# Patient Record
Sex: Female | Born: 1965 | Race: White | Hispanic: No | Marital: Married | Smoking: Former smoker
Health system: Southern US, Community
[De-identification: ages and names within clinical notes are randomized; demographics above are authoritative.]

## PROBLEM LIST (undated history)

## (undated) DIAGNOSIS — K219 Gastro-esophageal reflux disease without esophagitis: Secondary | ICD-10-CM

## (undated) DIAGNOSIS — E669 Obesity, unspecified: Secondary | ICD-10-CM

## (undated) DIAGNOSIS — I10 Essential (primary) hypertension: Secondary | ICD-10-CM

## (undated) DIAGNOSIS — F329 Major depressive disorder, single episode, unspecified: Secondary | ICD-10-CM

## (undated) DIAGNOSIS — R87629 Unspecified abnormal cytological findings in specimens from vagina: Secondary | ICD-10-CM

## (undated) DIAGNOSIS — E876 Hypokalemia: Secondary | ICD-10-CM

## (undated) DIAGNOSIS — T7840XA Allergy, unspecified, initial encounter: Secondary | ICD-10-CM

## (undated) DIAGNOSIS — F32A Depression, unspecified: Secondary | ICD-10-CM

## (undated) HISTORY — PX: CHOLECYSTECTOMY: SHX55

## (undated) HISTORY — PX: DILATION AND CURETTAGE OF UTERUS: SHX78

## (undated) HISTORY — PX: ADENOIDECTOMY: SUR15

## (undated) HISTORY — DX: Unspecified abnormal cytological findings in specimens from vagina: R87.629

## (undated) HISTORY — DX: Depression, unspecified: F32.A

## (undated) HISTORY — DX: Obesity, unspecified: E66.9

## (undated) HISTORY — DX: Hypokalemia: E87.6

## (undated) HISTORY — DX: Gastro-esophageal reflux disease without esophagitis: K21.9

## (undated) HISTORY — DX: Major depressive disorder, single episode, unspecified: F32.9

## (undated) HISTORY — DX: Allergy, unspecified, initial encounter: T78.40XA

## (undated) HISTORY — PX: AUGMENTATION MAMMAPLASTY: SUR837

## (undated) HISTORY — DX: Essential (primary) hypertension: I10

---

## 2013-05-20 ENCOUNTER — Ambulatory Visit: Payer: Self-pay | Admitting: Obstetrics and Gynecology

## 2014-01-27 ENCOUNTER — Ambulatory Visit: Payer: Self-pay | Admitting: Obstetrics and Gynecology

## 2014-02-03 ENCOUNTER — Ambulatory Visit: Payer: Self-pay | Admitting: Obstetrics and Gynecology

## 2015-01-05 LAB — HM PAP SMEAR: HM PAP: NEGATIVE

## 2015-01-10 ENCOUNTER — Other Ambulatory Visit: Payer: Self-pay | Admitting: Obstetrics and Gynecology

## 2015-01-10 DIAGNOSIS — N63 Unspecified lump in unspecified breast: Secondary | ICD-10-CM

## 2015-01-17 ENCOUNTER — Other Ambulatory Visit: Payer: Self-pay

## 2015-01-26 ENCOUNTER — Other Ambulatory Visit: Payer: Self-pay | Admitting: Obstetrics and Gynecology

## 2015-01-26 ENCOUNTER — Ambulatory Visit
Admission: RE | Admit: 2015-01-26 | Discharge: 2015-01-26 | Disposition: A | Discharge status: Home / Self Care | Payer: 59 | Source: Ambulatory Visit | Attending: Obstetrics and Gynecology | Admitting: Obstetrics and Gynecology

## 2015-01-26 ENCOUNTER — Ambulatory Visit: Admission: RE | Admit: 2015-01-26 | Payer: 59 | Source: Ambulatory Visit

## 2015-01-26 DIAGNOSIS — N63 Unspecified lump in unspecified breast: Secondary | ICD-10-CM

## 2015-03-02 ENCOUNTER — Encounter: Payer: Self-pay | Admitting: Family Medicine

## 2015-03-02 ENCOUNTER — Ambulatory Visit (INDEPENDENT_AMBULATORY_CARE_PROVIDER_SITE_OTHER): Payer: 59 | Admitting: Family Medicine

## 2015-03-02 VITALS — BP 116/72 | HR 80 | Temp 98.1°F | Resp 16 | Ht 67.0 in | Wt 214.3 lb

## 2015-03-02 DIAGNOSIS — R928 Other abnormal and inconclusive findings on diagnostic imaging of breast: Secondary | ICD-10-CM | POA: Insufficient documentation

## 2015-03-02 DIAGNOSIS — IMO0001 Reserved for inherently not codable concepts without codable children: Secondary | ICD-10-CM | POA: Insufficient documentation

## 2015-03-02 DIAGNOSIS — R768 Other specified abnormal immunological findings in serum: Secondary | ICD-10-CM | POA: Insufficient documentation

## 2015-03-02 DIAGNOSIS — F3341 Major depressive disorder, recurrent, in partial remission: Secondary | ICD-10-CM

## 2015-03-02 DIAGNOSIS — R1012 Left upper quadrant pain: Secondary | ICD-10-CM | POA: Insufficient documentation

## 2015-03-02 DIAGNOSIS — Z3040 Encounter for surveillance of contraceptives, unspecified: Secondary | ICD-10-CM | POA: Insufficient documentation

## 2015-03-02 DIAGNOSIS — L989 Disorder of the skin and subcutaneous tissue, unspecified: Secondary | ICD-10-CM | POA: Diagnosis not present

## 2015-03-02 DIAGNOSIS — R6889 Other general symptoms and signs: Secondary | ICD-10-CM | POA: Insufficient documentation

## 2015-03-02 DIAGNOSIS — M533 Sacrococcygeal disorders, not elsewhere classified: Secondary | ICD-10-CM

## 2015-03-02 DIAGNOSIS — R030 Elevated blood-pressure reading, without diagnosis of hypertension: Secondary | ICD-10-CM | POA: Insufficient documentation

## 2015-03-02 DIAGNOSIS — B49 Unspecified mycosis: Secondary | ICD-10-CM | POA: Insufficient documentation

## 2015-03-02 DIAGNOSIS — E66811 Obesity, class 1: Secondary | ICD-10-CM | POA: Insufficient documentation

## 2015-03-02 DIAGNOSIS — E538 Deficiency of other specified B group vitamins: Secondary | ICD-10-CM

## 2015-03-02 DIAGNOSIS — H9209 Otalgia, unspecified ear: Secondary | ICD-10-CM | POA: Insufficient documentation

## 2015-03-02 DIAGNOSIS — L209 Atopic dermatitis, unspecified: Secondary | ICD-10-CM | POA: Insufficient documentation

## 2015-03-02 DIAGNOSIS — R748 Abnormal levels of other serum enzymes: Secondary | ICD-10-CM | POA: Insufficient documentation

## 2015-03-02 DIAGNOSIS — R109 Unspecified abdominal pain: Secondary | ICD-10-CM | POA: Insufficient documentation

## 2015-03-02 DIAGNOSIS — Z01419 Encounter for gynecological examination (general) (routine) without abnormal findings: Secondary | ICD-10-CM | POA: Insufficient documentation

## 2015-03-02 DIAGNOSIS — E669 Obesity, unspecified: Secondary | ICD-10-CM | POA: Insufficient documentation

## 2015-03-02 DIAGNOSIS — R739 Hyperglycemia, unspecified: Secondary | ICD-10-CM | POA: Insufficient documentation

## 2015-03-02 MED ORDER — CITALOPRAM HYDROBROMIDE 40 MG PO TABS: 40.0000 mg | ORAL_TABLET | Freq: Every day | ORAL | Status: DC

## 2015-03-02 MED ORDER — TRAMADOL HCL 50 MG PO TABS: 50.0000 mg | ORAL_TABLET | Freq: Four times a day (QID) | ORAL | Status: DC | PRN

## 2015-03-02 MED ORDER — CYANOCOBALAMIN 1000 MCG/ML IJ SOLN
1000.0000 ug | Freq: Once | INTRAMUSCULAR | Status: AC
  Administered 2015-03-02: 1000 ug via INTRAMUSCULAR

## 2015-03-02 NOTE — Progress Notes (Signed)
Name: Gloria RiedelKaren C Pierce   MRN: [id number]    DOB: December 28, 1965   Date:03/02/2015       Progress Note  Subjective  Chief Complaint  Chief Complaint  Patient presents with  . Tailbone Pain    patient states that she has been having some pain right about her rectum and it radiates to the hips. Patient states it hurts more when she sits for a while. This statetd about 2 weeks ago.  Marland Kitchen. Referral    dermatology ([facility] - Dr. Cheree DittoGraham)  . Injections    B-12  . Sexual Problem    patient is concerned about low sex drive    HPI  Depression: Patient complains of depression. She complains of depressed mood, difficulty concentrating and fatigue. Onset was approximately several years ago, gradually worsening since that time.  She denies current suicidal and homicidal plan or intent.   Family history significant for anxiety and depression.Possible organic causes contributing are: medications, endocrine/metabolic, nutritional.  Risk factors: previous episode of depression Previous treatment includes Celexa and none. She complains of the following side effects from the treatment: decreased libido.  Skin lesion: Patient complains of skin lesion involving the torso and trunk. Rash started several years ago. Appearance of rash at onset: Texture of lesion(s): flat. Rash has not changed over time Initial distribution: back.  Discomfort associated with rash: causes no discomfort.  Associated symptoms: none. Denies: fever, headache, nausea and vomiting. Patient has had previous evaluation of rash. Patient has had previous treatment.  Response to treatment: good. Needs referral to return to see Dermatologist.   Back Pain: Patient presents for presents evaluation of low back problems.  Symptoms have been present for a few weeks and include pain in sacrum (aching and dull in character; 6/10 in severity). Initial inciting event: none. Symptoms are worst: if sitting or drivign. Alleviating  factors identifiable by patient are standing and walking. Exacerbating factors identifiable by patient are sitting. Treatments so far initiated by patient: tylenol. Denies fall or change in activity.  Is due for her B12 shot.    Patient Active Problem List   Diagnosis Date Noted  . Abdominal pain, recurrent 03/02/2015  . Nonspecific abnormal finding 03/02/2015  . Abnormal finding on mammography 03/02/2015  . Abdominal pain, left upper quadrant 03/02/2015  . Contraceptive surveillance 03/02/2015  . Delivery normal 03/02/2015  . AD (atopic dermatitis) 03/02/2015  . Dermatitis, eczematoid 03/02/2015  . Blood pressure elevated 03/02/2015  . Elevated blood sugar 03/02/2015  . Elevated lipase 03/02/2015  . BP (high blood pressure) 03/02/2015  . Unilateral earache 03/02/2015  . Mild depression 03/02/2015  . Adiposity 03/02/2015  . Helicobacter pylori ab+ 03/02/2015  . Encounter for routine gynecological examination 03/02/2015  . Disease caused by fungus 03/02/2015    History  Substance Use Topics  . Smoking status: Former Games developermoker  . Smokeless tobacco: Not on file  . Alcohol Use: 0.0 oz/week    0 Standard drinks or equivalent per week     Comment: very rare, ocassional mix drink     Current outpatient prescriptions:  .  citalopram (CELEXA) 20 MG tablet, , Disp: , Rfl:  .  hydrochlorothiazide (HYDRODIURIL) 25 MG tablet, Take by mouth., Disp: , Rfl:  .  ORTHO MICRONOR 0.35 MG tablet, TK 1 T PO QD, Disp: , Rfl: 4 .  phentermine (ADIPEX-P) 37.5 MG tablet, Take by mouth., Disp: , Rfl:  .  potassium chloride (K-DUR) 10 MEQ tablet, TK 1  T PO TWICE A WEEK., Disp: , Rfl: 1 .  cyanocobalamin (,VITAMIN B-12,) 1000 MCG/ML injection, CYANOCOBALAMIN, 1000MCG/ML (Injection Solution)  1 (one) Solution monthly for 1 days  Quantity: 3;  Refills: 2   Ordered :17-Nov-2014  Ines Bloomer, Melody CNM;  Started 17-Nov-2014 Active, Disp: , Rfl:   Past Surgical History  Procedure Laterality Date  . Augmentation  mammaplasty      2006  . Cholecystectomy    . Adenoidectomy    . Dilation and curettage of uterus      Family History  Problem Relation Age of Onset  . Heart disease Mother   . Diabetes Father   . Heart disease Father   . Diabetes Maternal Grandmother     Allergies  Allergen Reactions  . Codeine   . Penicillins     AND CLECION     Review of Systems  CONSTITUTIONAL: No significant weight changes, fever, chills, weakness or fatigue.  HEENT:  - Eyes: No visual changes.  - Ears: No auditory changes. No pain.  - Nose: No sneezing, congestion, runny nose. - Throat: No sore throat. No changes in swallowing. SKIN: Lesion on back. CARDIOVASCULAR: No chest pain, chest pressure or chest discomfort. No palpitations or edema.  RESPIRATORY: No shortness of breath, cough or sputum.  GASTROINTESTINAL: No anorexia, nausea, vomiting. No changes in bowel habits. No abdominal pain or blood.  GENITOURINARY: No dysuria. No frequency. No discharge.  NEUROLOGICAL: No headache, dizziness, syncope, paralysis, ataxia, numbness or tingling in the extremities. No memory changes. No change in bowel or bladder control.  MUSCULOSKELETAL: Yes joint pain. No muscle pain. HEMATOLOGIC: No anemia, bleeding or bruising.  LYMPHATICS: No enlarged lymph nodes.  PSYCHIATRIC: Yes change in mood. No change in sleep pattern.  ENDOCRINOLOGIC: No reports of sweating, cold or heat intolerance. No polyuria or polydipsia.      Objective  BP 116/72 mmHg  Pulse 80  Temp(Src) 98.1 F (36.7 C) (Oral)  Resp 16  Ht 5\' 7"  (1.702 m)  Wt 214 lb 4.8 oz (97.206 kg)  BMI 33.56 kg/m2  SpO2 98% Body mass index is 33.56 kg/(m^2).  Depression screen PHQ 2/9 03/02/2015  Decreased Interest 1  Down, Depressed, Hopeless 1  PHQ - 2 Score 2  Altered sleeping 1  Tired, decreased energy 3  Change in appetite 0  Feeling bad or failure about yourself  0  Trouble concentrating 0  Moving slowly or fidgety/restless 0  Suicidal  thoughts 0  PHQ-9 Score 6  Difficult doing work/chores Somewhat difficult      Physical Exam  Constitutional: Patient is obese and well-nourished. In no distress.  HEENT:  - Head: Normocephalic and atraumatic.  - Ears: Bilateral TMs gray, no erythema or effusion - Nose: Nasal mucosa moist - Mouth/Throat: Oropharynx is clear and moist. No tonsillar hypertrophy or erythema. No post nasal drainage.  - Eyes: Conjunctivae clear, EOM movements normal. PERRLA. No scleral icterus.  Neck: Normal range of motion. Neck supple. No JVD present. No thyromegaly present.  Cardiovascular: Normal rate, regular rhythm and normal heart sounds.  No murmur heard.  Pulmonary/Chest: Effort normal and breath sounds normal. No respiratory distress. Musculoskeletal: Normal range of motion bilateral UE and LE, no joint effusions. Lumbar spine exaggerated Lordosis curve with no paraspinal muscle tenderness. Straight leg test negative. Bilateral hip joints negative for pain internal external flexion extension rotation, full ROM.  Peripheral vascular: Bilateral LE no edema. Neurological: CN II-XII grossly intact with no focal deficits. Alert and oriented to person, place,  and time. Coordination, balance, strength, speech and gait are normal.  Skin: Skin is warm and dry. No rash noted. Scattered flat hyperpigmented lesions on mid to lower back. Psychiatric: Patient has a stable mood and affect. Behavior is normal in office today. Judgment and thought content normal in office today.   Assessment & Plan   1. Skin lesion of back  - Ambulatory referral to Dermatology  2. Depression, major, recurrent, in partial remission Increase celexa from  to . Monitor libido.   - citalopram (CELEXA) 40 MG tablet; Take 1 tablet (40 mg total) by mouth daily.  Dispense: 30 tablet; Refill: 5  3. Vitamin B12 deficiency Shot given in office today.  - cyanocobalamin ((VITAMIN B-12)) injection 1,000 mcg; Inject 1 mL (1,000  mcg total) into the muscle once.  4. Sacral back pain Will defer xray unless symptoms become ongoing. Instructed on home exercises. Encouraged her to go back to Dr. Anne Hahn, chiropractor.  - traMADol (ULTRAM) 50 MG tablet; Take 1 tablet (50 mg total) by mouth every 6 (six) hours as needed for moderate pain or severe pain.  Dispense: 60 tablet; Refill: 0

## 2015-03-02 NOTE — Patient Instructions (Signed)

## 2015-05-08 ENCOUNTER — Other Ambulatory Visit: Payer: Self-pay | Admitting: *Deleted

## 2015-05-08 MED ORDER — METRONIDAZOLE 500 MG PO TABS: 500.0000 mg | ORAL_TABLET | Freq: Two times a day (BID) | ORAL | Status: DC

## 2015-05-08 MED ORDER — TETRACYCLINE HCL 250 MG PO CAPS: 250.0000 mg | ORAL_CAPSULE | Freq: Four times a day (QID) | ORAL | Status: DC

## 2015-06-15 IMAGING — US ABDOMEN ULTRASOUND
1 series · 14 of 25 positions shown · non-contrast
Comparison: none

REASON FOR EXAM: LUQ abd pain
COMMENTS:

[Series 1: abdomen ultrasound · 0.30mm/px · 14 of 91 slices shown]
[im 1/91]
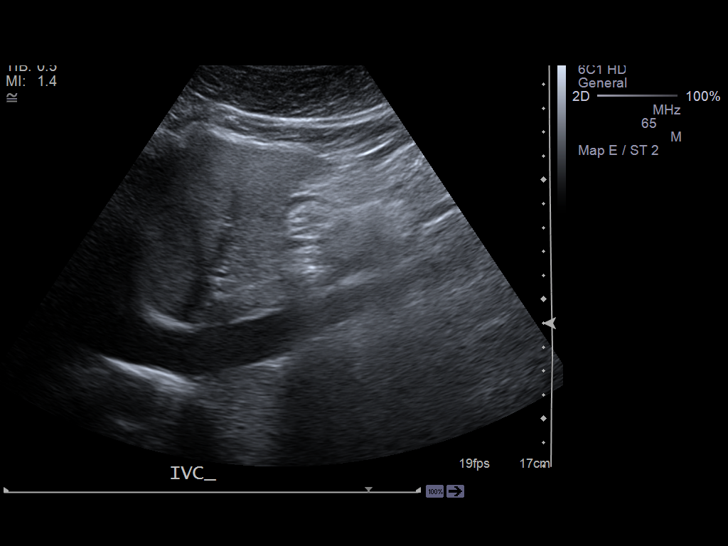
[im 8/91]
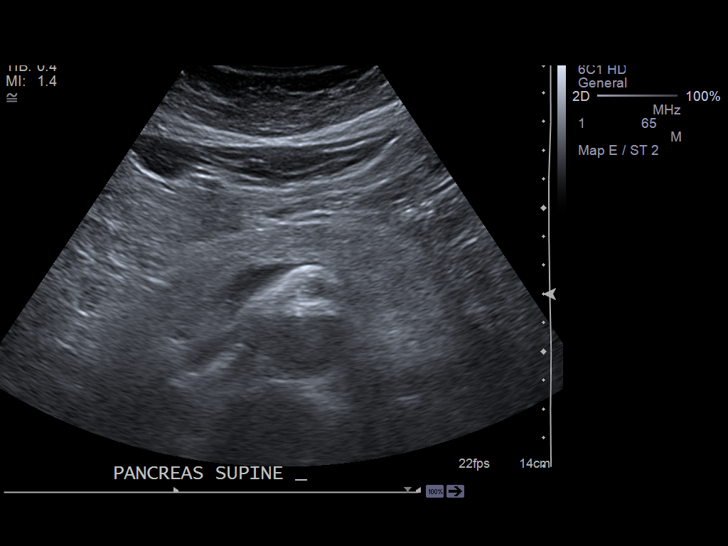
[im 16/91]
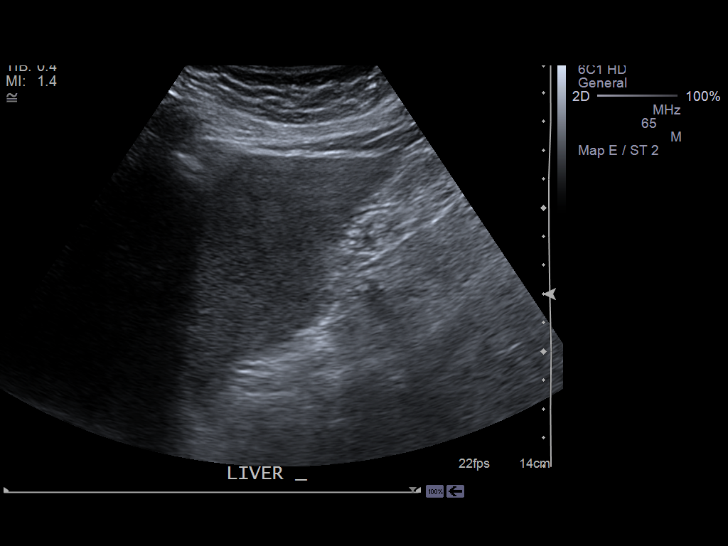
[im 23/91]
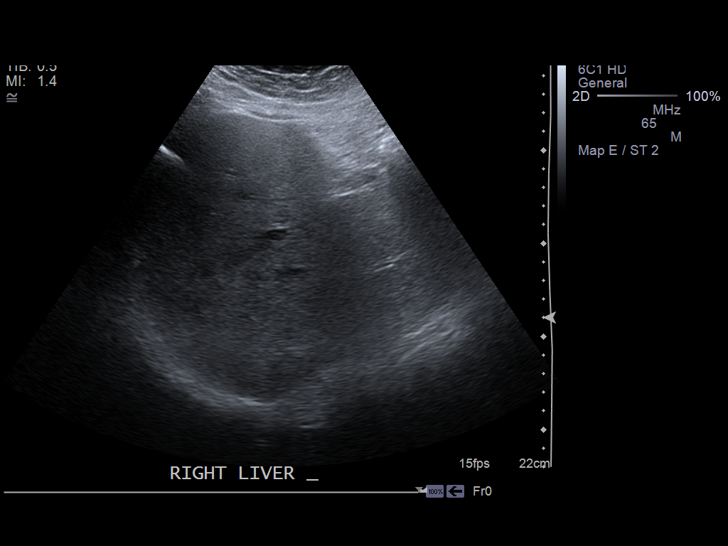
[im 31/91]
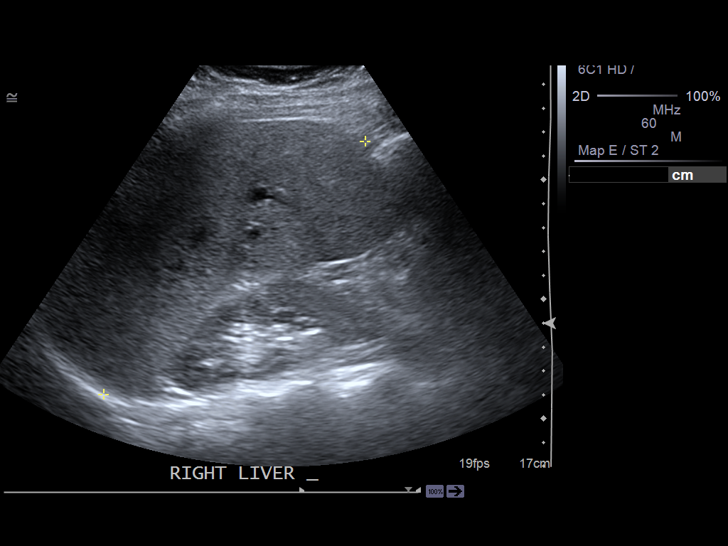
[im 34/91]
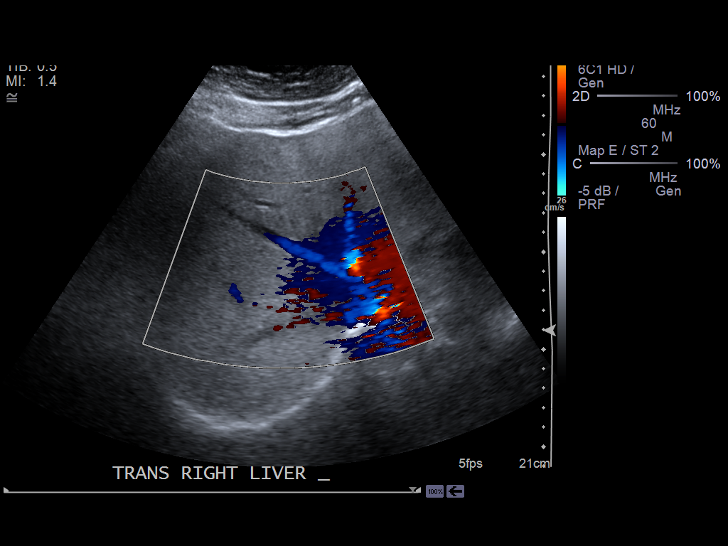
[im 42/91]
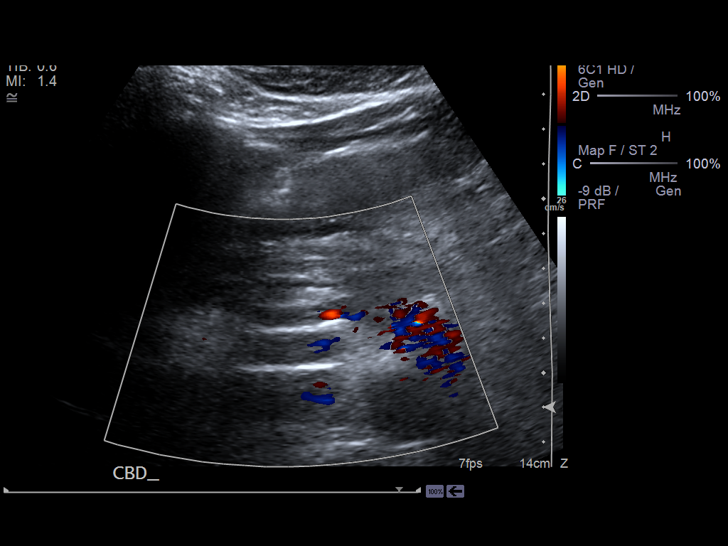
[im 49/91]
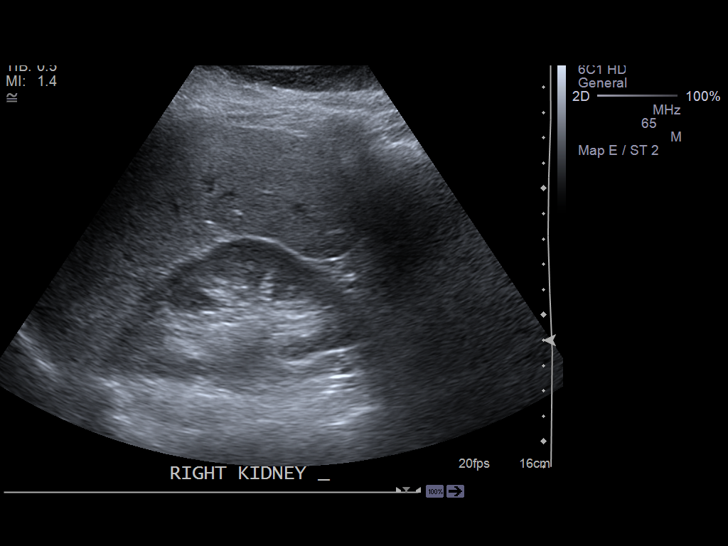
[im 57/91]
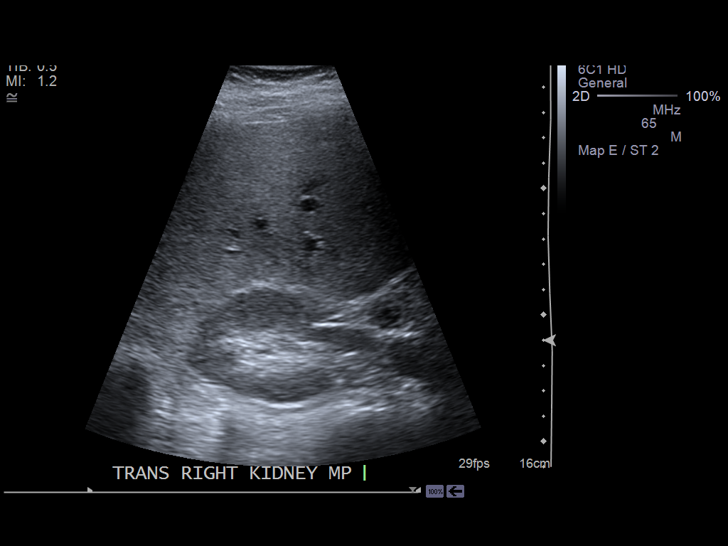
[im 61/91]
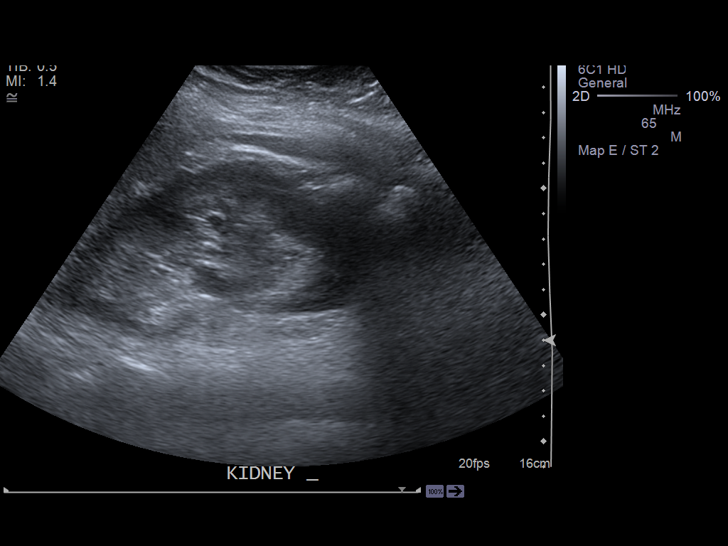
[im 68/91]
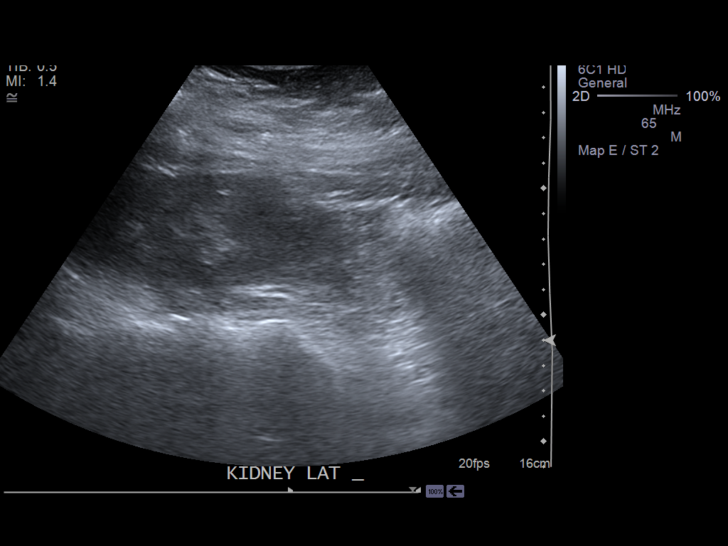
[im 76/91]
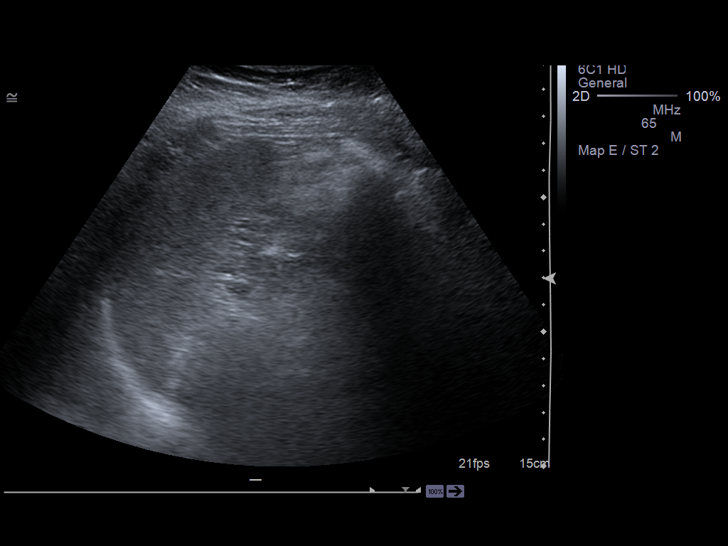
[im 83/91]
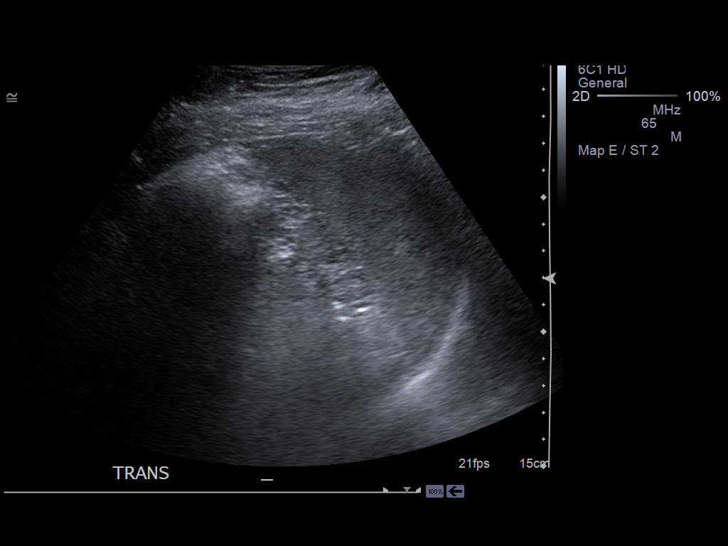
[im 91/91]
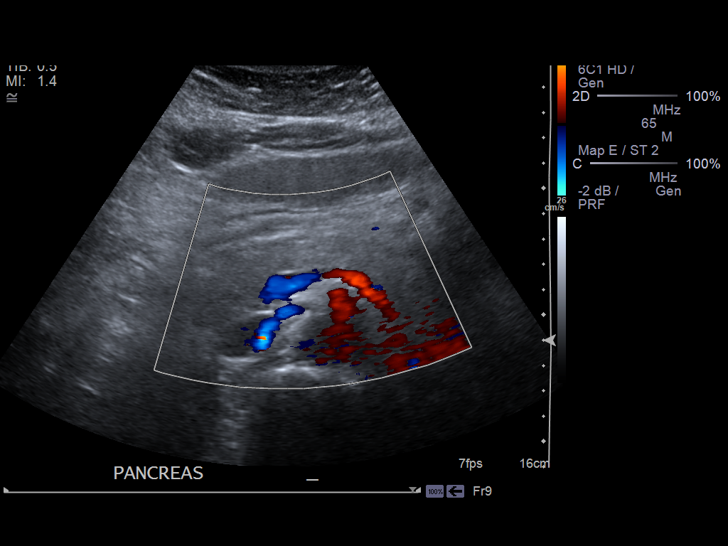

[14 of 25 positions shown; findings below may reference images not displayed]

PROCEDURE:     US  - US ABDOMEN GENERAL SURVEY  - May 20, 2013  [DATE]

RESULT:     Abdominal ultrasound is performed. The patient has undergone
previous cholecystectomy. The proximal inferior vena cava is patent. The
abdominal aorta tapers distally without evidence of aneurysm. The visualized
pancreas appears unremarkable without mass or ductal dilation. The liver
echotexture is slightly heterogeneous with a liver length of 15.25 cm
demonstrated. The portal venous flow is normal. The common bile but diameter
measures 5.3 mm which is borderline distended. There is no ascites. The
right kidney measures 10.40 x 4.72 x 4.45 cm. The left kidney measures
x 5.59 x 6.06 cm. Neither kidney shows obstruction or solid or cystic mass.
No stones are evident. The spleen measures up to 7.60 cm in length and
appears homogeneous.
IMPRESSION: 1. Status post cholecystectomy. No focal abnormality.

[REDACTED]

## 2015-08-18 ENCOUNTER — Other Ambulatory Visit: Payer: Self-pay | Admitting: Obstetrics and Gynecology

## 2015-08-28 ENCOUNTER — Other Ambulatory Visit: Payer: Self-pay

## 2015-08-28 DIAGNOSIS — F3341 Major depressive disorder, recurrent, in partial remission: Secondary | ICD-10-CM

## 2015-08-28 MED ORDER — CITALOPRAM HYDROBROMIDE 40 MG PO TABS: 40.0000 mg | ORAL_TABLET | Freq: Every day | ORAL | Status: DC

## 2015-08-28 NOTE — Telephone Encounter (Signed)
[facility] a refill of this patient Celexa 40mg .  Refill request was sent to Dr. Edwena FeltyAshany Sundaram for approval and submission.

## 2015-12-03 ENCOUNTER — Other Ambulatory Visit: Payer: Self-pay | Admitting: *Deleted

## 2015-12-03 DIAGNOSIS — M79671 Pain in right foot: Secondary | ICD-10-CM

## 2015-12-03 DIAGNOSIS — M79672 Pain in left foot: Principal | ICD-10-CM

## 2015-12-03 MED ORDER — AZELASTINE-FLUTICASONE 137-50 MCG/ACT NA SUSP: 1.0000 | Freq: Every day | NASAL | Status: DC

## 2015-12-07 ENCOUNTER — Ambulatory Visit: Payer: Self-pay | Admitting: Family Medicine

## 2015-12-07 ENCOUNTER — Telehealth: Payer: Self-pay | Admitting: Obstetrics and Gynecology

## 2015-12-07 NOTE — Telephone Encounter (Signed)
Have them send us a prior auth form as this is what she has been on

## 2015-12-07 NOTE — Telephone Encounter (Signed)
Alvino ChapelEllen at [facility] Called and said that [employer]ler's insurance does not cover Dymista. Call to resolve issue.

## 2015-12-08 ENCOUNTER — Other Ambulatory Visit: Payer: Self-pay | Admitting: Obstetrics and Gynecology

## 2015-12-10 NOTE — Telephone Encounter (Signed)
Sent PA to [facility]

## 2015-12-24 ENCOUNTER — Telehealth: Payer: Self-pay | Admitting: Obstetrics and Gynecology

## 2015-12-24 NOTE — Telephone Encounter (Signed)
pls advise

## 2015-12-24 NOTE — Telephone Encounter (Signed)
Gloria Pierce IS THE PT (GOD DAUGHTER OF Allye Franzen//  APPT COMING UP SHE WANTS TO KNOW WHAT ITS ABOUT. SHE SAID IS MELODY GOING TO GIVE HER A PILL TO START HER PERIOD OR WHAT?

## 2015-12-25 NOTE — Telephone Encounter (Signed)
Please let her know, now that Gloria Pierce is >50yo, we can not give out any info unless see is added to HIPPA forms.

## 2015-12-25 NOTE — Telephone Encounter (Signed)
Spoke with pt

## 2016-01-04 ENCOUNTER — Encounter: Payer: Self-pay | Admitting: Sports Medicine

## 2016-01-04 ENCOUNTER — Ambulatory Visit (INDEPENDENT_AMBULATORY_CARE_PROVIDER_SITE_OTHER): Payer: BLUE CROSS/BLUE SHIELD

## 2016-01-04 ENCOUNTER — Ambulatory Visit (INDEPENDENT_AMBULATORY_CARE_PROVIDER_SITE_OTHER): Payer: BLUE CROSS/BLUE SHIELD | Admitting: Sports Medicine

## 2016-01-04 DIAGNOSIS — M79673 Pain in unspecified foot: Secondary | ICD-10-CM

## 2016-01-04 DIAGNOSIS — M779 Enthesopathy, unspecified: Secondary | ICD-10-CM | POA: Diagnosis not present

## 2016-01-04 DIAGNOSIS — M2040 Other hammer toe(s) (acquired), unspecified foot: Secondary | ICD-10-CM

## 2016-01-04 DIAGNOSIS — M21619 Bunion of unspecified foot: Secondary | ICD-10-CM

## 2016-01-04 DIAGNOSIS — M722 Plantar fascial fibromatosis: Secondary | ICD-10-CM | POA: Diagnosis not present

## 2016-01-04 MED ORDER — TRIAMCINOLONE ACETONIDE 10 MG/ML IJ SUSP: 10.0000 mg | Freq: Once | INTRAMUSCULAR | Status: AC

## 2016-01-04 MED ORDER — METHYLPREDNISOLONE 4 MG PO TBPK: ORAL_TABLET | ORAL | Status: DC

## 2016-01-04 MED ORDER — MELOXICAM 15 MG PO TABS: 15.0000 mg | ORAL_TABLET | Freq: Every day | ORAL | Status: DC

## 2016-01-04 NOTE — Progress Notes (Signed)
Patient ID: Gloria Pierce, female   DOB: 1965-10-17, 50 y.o.   MRN: [id number] Subjective: Gloria RiedelKaren C Pierce is a 50 y.o. female patient presents to office with complaint of 1. Left 2nd toe pain with numbness and 2.bilateral heel pain that is worse at night or after a period of rest and go to stand. This has been present for a couple of months. Patient has treated this problem with motrin and tylenol with no relief. Reports that the Vionic sandals help. Denies any other pedal complaints.   Patient Active Problem List   Diagnosis Date Noted  . Abdominal pain, recurrent 03/02/2015  . Nonspecific abnormal finding 03/02/2015  . Abnormal finding on mammography 03/02/2015  . Abdominal pain, left upper quadrant 03/02/2015  . Contraceptive surveillance 03/02/2015  . Vitamin B12 deficiency 03/02/2015  . AD (atopic dermatitis) 03/02/2015  . Skin lesion of back 03/02/2015  . Elevated blood sugar 03/02/2015  . Elevated lipase 03/02/2015  . Elevated blood pressure reading without diagnosis of hypertension 03/02/2015  . Depression, major, recurrent, in partial remission (HCC) 03/02/2015  . Obesity, Class I, BMI 30-34.9 03/02/2015  . Helicobacter pylori ab+ 03/02/2015  . Encounter for routine gynecological examination 03/02/2015  . Disease caused by fungus 03/02/2015    Current Outpatient Prescriptions on File Prior to Visit  Medication Sig Dispense Refill  . Azelastine-Fluticasone (DYMISTA) 137-50 MCG/ACT SUSP Place 1 spray into the nose daily. 1 Bottle 3  . citalopram (CELEXA) 40 MG tablet Take 1 tablet (40 mg total) by mouth daily. 90 tablet 1  . cyanocobalamin (,VITAMIN B-12,) 1000 MCG/ML injection CYANOCOBALAMIN, 1000MCG/ML (Injection Solution)  1 (one) Solution monthly for 1 days  Quantity: 3;  Refills: 2   Ordered :17-Nov-2014  Ines BloomerBurr, Melody CNM;  Started 17-Nov-2014 Active    . hydrochlorothiazide (HYDRODIURIL) 25 MG tablet Take by mouth.    . metroNIDAZOLE (FLAGYL) 500 MG tablet Take 1 tablet  (500 mg total) by mouth 2 (two) times daily. 28 tablet 0  . ORTHO MICRONOR 0.35 MG tablet TK 1 T PO QD  4  . phentermine (ADIPEX-P) 37.5 MG tablet Take by mouth.    . potassium chloride (K-DUR) 10 MEQ tablet TAKE 1 TABLET BY MOUTH TWICE A WEEK 30 tablet 0  . tetracycline (ACHROMYCIN,SUMYCIN) 250 MG capsule Take 1 capsule (250 mg total) by mouth 4 (four) times daily. 28 capsule 0  . traMADol (ULTRAM) 50 MG tablet Take 1 tablet (50 mg total) by mouth every 6 (six) hours as needed for moderate pain or severe pain. 60 tablet 0   No current facility-administered medications on file prior to visit.    Allergies  Allergen Reactions  . Codeine   . Penicillins     AND CLECION    Objective: Physical Exam General: The patient is alert and oriented x3 in no acute distress. Obese  Dermatology: Skin is warm, dry and supple bilateral lower extremities. Nails 1-10 are normal. There is no erythema, edema, no eccymosis, no open lesions present. Integument is otherwise unremarkable.  Vascular: Dorsalis Pedis pulse and Posterior Tibial pulse are 2/4 bilateral. Capillary fill time is immediate to all digits.  Neurological: Grossly intact to light touch with an achilles reflex of +2/5 and a  negative Tinel's sign bilateral. Negative moulders sign on left.   Musculoskeletal: Tenderness to palpation at the left 2nd MTPJ and medial calcaneal tubercale and through the insertion of the plantar fascia on the bilateral. No pain with compression of calcaneus bilateral. No pain with tuning fork  to calcaneus bilateral. No pain with calf compression bilateral. There is decreased Ankle joint range of motion bilateral. All other joints range of motion within normal limits bilateral. Strength 5/5 in all groups bilateral. Early bunion and hammertoe L>R.   Xray, Right and Left foot:  Normal osseous mineralization. Joint spaces preserved. Early bunion and hammertoe, midfoot arthritic changes, No fracture/dislocation/boney  destruction. Calcaneal spur present with mild thickening of plantar fascia. No other soft tissue abnormalities or radiopaque foreign bodies.   Assessment and Plan: Problem List Items Addressed This Visit    None    Visit Diagnoses    Foot pain, unspecified laterality    -  Primary    Relevant Medications    triamcinolone acetonide (KENALOG) 10 MG/ML injection 10 mg    methylPREDNISolone (MEDROL DOSEPAK) 4 MG TBPK tablet    meloxicam (MOBIC) 15 MG tablet    Other Relevant Orders    DG Foot 2 Views Left    DG Foot 2 Views Right    Plantar fasciitis, bilateral        Relevant Medications    methylPREDNISolone (MEDROL DOSEPAK) 4 MG TBPK tablet    meloxicam (MOBIC) 15 MG tablet    Capsulitis        Left 2nd MTPJ    Relevant Medications    triamcinolone acetonide (KENALOG) 10 MG/ML injection 10 mg    methylPREDNISolone (MEDROL DOSEPAK) 4 MG TBPK tablet    meloxicam (MOBIC) 15 MG tablet    Bunion        Relevant Medications    methylPREDNISolone (MEDROL DOSEPAK) 4 MG TBPK tablet    meloxicam (MOBIC) 15 MG tablet    Hammertoe, unspecified laterality        Relevant Medications    methylPREDNISolone (MEDROL DOSEPAK) 4 MG TBPK tablet    meloxicam (MOBIC) 15 MG tablet       -Complete examination performed. -Xrays reviewed   -Discussed with patient in detail the condition of capsulitis and plantar fasciitis, how this occurs and general treatment options. Explained both conservative and surgical treatments.  -After oral consent and aseptic prep, injected a mixture containing 1 ml of 2%  plain lidocaine, 1 ml 0.5% plain marcaine, 0.5 ml of kenalog 10 and 0.5 ml of dexamethasone phosphate into left 2nd MTPJ at point of max tenderness Post-injection care discussed with patient.  -Rx Meloxicamto start after Medrol dose pack is completed -Recommended good supportive shoes and advised use of OTC insert. Explained to patient that if these orthoses work well, we will continue with these. If  these do not improve her condition and  pain, we will consider custom molded orthoses. - Explained in detail the use of the fascial braces which were dispensed at today's visit. -Explained and dispensed to patient daily stretching exercises. -Recommend patient to ice affected area 1-2x daily. -Patient to return to office in 3 weeks for follow up or sooner if problems or questions arise.  Asencion Islam, DPM

## 2016-01-04 NOTE — Patient Instructions (Signed)

## 2016-01-07 ENCOUNTER — Encounter: Payer: Self-pay | Admitting: *Deleted

## 2016-01-08 ENCOUNTER — Other Ambulatory Visit: Payer: Self-pay | Admitting: Obstetrics and Gynecology

## 2016-01-11 ENCOUNTER — Encounter: Payer: Self-pay | Admitting: Obstetrics and Gynecology

## 2016-01-11 ENCOUNTER — Ambulatory Visit (INDEPENDENT_AMBULATORY_CARE_PROVIDER_SITE_OTHER): Payer: BLUE CROSS/BLUE SHIELD | Admitting: Obstetrics and Gynecology

## 2016-01-11 VITALS — BP 134/87 | HR 65 | Ht 66.0 in | Wt 232.9 lb

## 2016-01-11 DIAGNOSIS — Z01419 Encounter for gynecological examination (general) (routine) without abnormal findings: Secondary | ICD-10-CM | POA: Diagnosis not present

## 2016-01-11 DIAGNOSIS — F3341 Major depressive disorder, recurrent, in partial remission: Secondary | ICD-10-CM | POA: Diagnosis not present

## 2016-01-11 MED ORDER — HYDROCHLOROTHIAZIDE 25 MG PO TABS: 25.0000 mg | ORAL_TABLET | Freq: Every day | ORAL | Status: DC

## 2016-01-11 MED ORDER — CITALOPRAM HYDROBROMIDE 40 MG PO TABS: 40.0000 mg | ORAL_TABLET | Freq: Every day | ORAL | Status: DC

## 2016-01-11 MED ORDER — ORTHO MICRONOR 0.35 MG PO TABS: ORAL_TABLET | ORAL | Status: DC

## 2016-01-11 MED ORDER — POTASSIUM CHLORIDE ER 10 MEQ PO TBCR: EXTENDED_RELEASE_TABLET | ORAL | Status: DC

## 2016-01-11 MED ORDER — CYANOCOBALAMIN 1000 MCG/ML IJ SOLN: INTRAMUSCULAR | Status: AC

## 2016-01-11 MED ORDER — PHENTERMINE HCL 37.5 MG PO TABS: 37.5000 mg | ORAL_TABLET | Freq: Every day | ORAL | Status: DC

## 2016-01-11 NOTE — Patient Instructions (Signed)
  Place annual gynecologic exam patient instructions here.  Thank you for enrolling in MyChart. Please follow the instructions below to securely access your online medical record. MyChart allows you to send messages to your doctor, view your test results, manage appointments, and more.   How Do I Sign Up? 1. In your Internet browser, go to [employer] Address [employer] and enter [email or web address]. 2. Click on the Sign Up Now link in the Sign In box. You will see the New Member Sign Up page. 3. Enter your MyChart Access Code exactly as it appears below. You will not need to use this code after you've completed the sign-up process. If you do not sign up before the expiration date, you must request a new code.  MyChart Access Code: [email or web address] Expires: 01/25/2016 11:35 AM  4. Enter your Social Security Number ([id number]) and Date of Birth (mm/dd/yyyy) as indicated and click Submit. You will be taken to the next sign-up page. 5. Create a MyChart ID. This will be your MyChart login ID and cannot be changed, so think of one that is secure and easy to remember. 6. Create a MyChart password. You can change your password at any time. 7. Enter your Password Reset Question and Answer. This can be used at a later time if you forget your password.  8. Enter your e-mail address. You will receive e-mail notification when new information is available in MyChart. 9. Click Sign Up. You can now view your medical record.   Additional Information Remember, MyChart is NOT to be used for urgent needs. For medical emergencies, dial 911.

## 2016-01-11 NOTE — Progress Notes (Signed)
Subjective:   Gloria Pierce is a 50 y.o. G58P1 Caucasian female here for a routine well-woman exam.  No LMP recorded. Patient is not currently having periods (Reason: Oral contraceptives).    Current complaints: weight gain; desires restart weight loss meds. PCP: sudaram       doesn't desire labs  Social History: Sexual: heterosexual Marital Status: married Living situation: with spouse Occupation: hairdresser Tobacco/alcohol: no tobacco use Illicit drugs: no history of illicit drug use  The following portions of the patient's history were reviewed and updated as appropriate: allergies, current medications, past family history, past medical history, past social history, past surgical history and problem list.  Past Medical History Past Medical History  Diagnosis Date  . Depression   . GERD (gastroesophageal reflux disease)   . Allergy   . Vaginal Pap smear, abnormal   . Low potassium syndrome   . Hypertension   . Obesity     Past Surgical History Past Surgical History  Procedure Laterality Date  . Augmentation mammaplasty      2006  . Cholecystectomy    . Adenoidectomy    . Dilation and curettage of uterus      Gynecologic History G3P1  No LMP recorded. Patient is not currently having periods (Reason: Oral contraceptives). Contraception: oral progesterone-only contraceptive Last Pap: 2016. Results were: normal Last mammogram: 2017. Results were: abnormal   Obstetric History OB History  Gravida Para Term Preterm AB SAB TAB Ectopic Multiple Living  3 1            # Outcome Date GA Lbr Len/2nd Weight Sex Delivery Anes PTL Lv  3 Gravida           2 Gravida           1 Para               Current Medications Current Outpatient Prescriptions on File Prior to Visit  Medication Sig Dispense Refill  . meloxicam (MOBIC) 15 MG tablet Take 1 tablet (15 mg total) by mouth daily. 30 tablet 0  . Azelastine-Fluticasone (DYMISTA) 137-50 MCG/ACT SUSP Place 1 spray into the  nose daily. (Patient not taking: Reported on 01/11/2016) 1 Bottle 3  . methylPREDNISolone (MEDROL DOSEPAK) 4 MG TBPK tablet Take first as instructed (Patient not taking: Reported on 01/11/2016) 21 tablet 0  . metroNIDAZOLE (FLAGYL) 500 MG tablet Take 1 tablet (500 mg total) by mouth 2 (two) times daily. (Patient not taking: Reported on 01/11/2016) 28 tablet 0  . tetracycline (ACHROMYCIN,SUMYCIN) 250 MG capsule Take 1 capsule (250 mg total) by mouth 4 (four) times daily. (Patient not taking: Reported on 01/11/2016) 28 capsule 0  . traMADol (ULTRAM) 50 MG tablet Take 1 tablet (50 mg total) by mouth every 6 (six) hours as needed for moderate pain or severe pain. (Patient not taking: Reported on 01/11/2016) 60 tablet 0   Current Facility-Administered Medications on File Prior to Visit  Medication Dose Route Frequency Provider Last Rate Last Dose  . triamcinolone acetonide (KENALOG) 10 MG/ML injection 10 mg  10 mg Other Once Asencion Islam, DPM        Review of Systems Patient denies any headaches, blurred vision, shortness of breath, chest pain, abdominal pain, problems with bowel movements, urination, or intercourse.  Objective:  BP 134/87 mmHg  Pulse 65  Ht  (1.676 m)  Wt 232 lb 14.4 oz (105.643 kg)  BMI 37.61 kg/m2 Physical Exam  General:  Well developed, well nourished, no acute distress. She  is alert and oriented x3. Skin:  Warm and dry Neck:  Midline trachea, no thyromegaly or nodules Cardiovascular: Regular rate and rhythm, no murmur heard Lungs:  Effort normal, all lung fields clear to auscultation bilaterally Breasts:  No dominant palpable mass, retraction, or nipple discharge Abdomen:  Soft, non tender, no hepatosplenomegaly or masses Pelvic:  External genitalia is normal in appearance.  The vagina is normal in appearance. The cervix is bulbous, no CMT.  Thin prep pap is not done. Uterus is felt to be normal size, shape, and contour.  No adnexal masses or tenderness  noted. Extremities:  No swelling or varicosities noted Psych:  She has a normal mood and affect  Assessment:   Healthy well-woman exam Obesity HTN SSRI use  Plan:  Restart weigh tloss meds- will wait on labs as insurance doesn't cover them F/U 1 year  for AE, or sooner if needed Mammogram scheduled  Jocelyne Reinertsen Suzan NailerN Skyanne Welle, CNM

## 2016-01-15 ENCOUNTER — Other Ambulatory Visit: Payer: Self-pay | Admitting: *Deleted

## 2016-01-15 ENCOUNTER — Telehealth: Payer: Self-pay | Admitting: Obstetrics and Gynecology

## 2016-01-15 DIAGNOSIS — Z01419 Encounter for gynecological examination (general) (routine) without abnormal findings: Secondary | ICD-10-CM

## 2016-01-15 NOTE — Telephone Encounter (Signed)
Her order is not in for mammogram, can you put it in and she wants me to call her and let her know.

## 2016-01-15 NOTE — Telephone Encounter (Signed)
Patient called stating she is having issues scheduling her mammogram. [facility] is telling her that the order is in incorrectly and the office needs to call them directly to get her scheduled. Patient is frustrated as she says this happens every year. Thanks

## 2016-01-16 ENCOUNTER — Other Ambulatory Visit: Payer: Self-pay | Admitting: *Deleted

## 2016-01-16 DIAGNOSIS — Z01419 Encounter for gynecological examination (general) (routine) without abnormal findings: Secondary | ICD-10-CM

## 2016-01-16 NOTE — Telephone Encounter (Signed)
See other message

## 2016-01-16 NOTE — Telephone Encounter (Signed)
Called jamie @ [facility], got correct codes

## 2016-01-16 NOTE — Telephone Encounter (Signed)
Changed pts mammo order hopefully correct this time

## 2016-02-01 ENCOUNTER — Encounter: Payer: Self-pay | Admitting: Sports Medicine

## 2016-02-01 ENCOUNTER — Ambulatory Visit (INDEPENDENT_AMBULATORY_CARE_PROVIDER_SITE_OTHER): Payer: BLUE CROSS/BLUE SHIELD | Admitting: Sports Medicine

## 2016-02-01 DIAGNOSIS — M2040 Other hammer toe(s) (acquired), unspecified foot: Secondary | ICD-10-CM | POA: Diagnosis not present

## 2016-02-01 DIAGNOSIS — M21619 Bunion of unspecified foot: Secondary | ICD-10-CM

## 2016-02-01 DIAGNOSIS — M79673 Pain in unspecified foot: Secondary | ICD-10-CM

## 2016-02-01 DIAGNOSIS — M779 Enthesopathy, unspecified: Secondary | ICD-10-CM

## 2016-02-01 DIAGNOSIS — M722 Plantar fascial fibromatosis: Secondary | ICD-10-CM

## 2016-02-01 MED ORDER — MELOXICAM 15 MG PO TABS: 15.0000 mg | ORAL_TABLET | Freq: Every day | ORAL | Status: DC

## 2016-02-01 NOTE — Progress Notes (Signed)
Patient ID: Gloria RiedelKaren C Pierce, female   DOB: [birth year], 50 y.o.   MRN: [id number] Subjective: Gloria RiedelKaren C Pierce is a 50 y.o. female returns to office for follow up evaluation after Left 2nd MTPJ injection for capsulitis and for follow up of bilateral heel pain. Patient states that the injection seems to help her pain at the 2nd toe however pain is still present at heels that feels best after stretching. Patient denies any other recent changes in medications or new problems since last visit.   Patient Active Problem List   Diagnosis Date Noted  . Contraceptive surveillance 03/02/2015  . Vitamin B12 deficiency 03/02/2015  . AD (atopic dermatitis) 03/02/2015  . Elevated blood sugar 03/02/2015  . Elevated lipase 03/02/2015  . Depression, major, recurrent, in partial remission (HCC) 03/02/2015    Current Outpatient Prescriptions on File Prior to Visit  Medication Sig Dispense Refill  . Azelastine-Fluticasone (DYMISTA) 137-50 MCG/ACT SUSP Place 1 spray into the nose daily. (Patient not taking: Reported on 01/11/2016) 1 Bottle 3  . citalopram (CELEXA) 40 MG tablet Take 1 tablet (40 mg total) by mouth daily. 90 tablet 4  . cyanocobalamin (,VITAMIN B-12,) 1000 MCG/ML injection Reported on 01/11/2016 3 mL 4  . hydrochlorothiazide (HYDRODIURIL) 25 MG tablet Take 1 tablet (25 mg total) by mouth daily. 90 tablet 4  . meloxicam (MOBIC) 15 MG tablet Take 1 tablet (15 mg total) by mouth daily. 30 tablet 0  . methylPREDNISolone (MEDROL DOSEPAK) 4 MG TBPK tablet Take first as instructed (Patient not taking: Reported on 01/11/2016) 21 tablet 0  . metroNIDAZOLE (FLAGYL) 500 MG tablet Take 1 tablet (500 mg total) by mouth 2 (two) times daily. (Patient not taking: Reported on 01/11/2016) 28 tablet 0  . ORTHO MICRONOR 0.35 MG tablet TK 1 T PO QD 1 Package 12  . phentermine (ADIPEX-P) 37.5 MG tablet Take 1 tablet (37.5 mg total) by mouth daily before breakfast. Reported on 01/11/2016 30 tablet 2  . potassium chloride (K-DUR) 10  MEQ tablet TAKE 1 TABLET BY MOUTH TWICE A WEEK 90 tablet 4  . tetracycline (ACHROMYCIN,SUMYCIN) 250 MG capsule Take 1 capsule (250 mg total) by mouth 4 (four) times daily. (Patient not taking: Reported on 01/11/2016) 28 capsule 0  . traMADol (ULTRAM) 50 MG tablet Take 1 tablet (50 mg total) by mouth every 6 (six) hours as needed for moderate pain or severe pain. (Patient not taking: Reported on 01/11/2016) 60 tablet 0   Current Facility-Administered Medications on File Prior to Visit  Medication Dose Route Frequency Provider Last Rate Last Dose  . triamcinolone acetonide (KENALOG) 10 MG/ML injection 10 mg  10 mg Other Once Asencion Islamitorya Elynore Dolinski, DPM        Allergies  Allergen Reactions  . Codeine   . Penicillins     AND CLECION    Objective:   General:  Alert and oriented x 3, in no acute distress  Dermatology: Skin is warm, dry, and supple bilateral. Nails are within normal limits. There is no lower extremity erythema, no eccymosis, no open lesions present bilateral.   Vascular: Dorsalis Pedis and Posterior Tibial pedal pulses are 2/4 bilateral. + hair growth noted bilateral. Capillary Fill Time is 3 seconds in all digits. No varicosities, No edema bilateral lower extremities.   Neurological: Sensation grossly intact to light touch with an achilles reflex of +2 and a  negative Tinel's sign bilateral. Vibratory, sharp/dull, Semmes Weinstein Monofilament within normal limits.   Musculoskeletal: There is no pain to Left 2nd MTPJ  with early hammertoe, there is decreased tenderness to palpation at the medial calcaneal tubercale and through the insertion of the plantar fascia bilateral. No pain with compression to calcaneus or application of tuning fork. There is decreased Ankle joint range of motion bilateral. All other joints range of motion  within normal limits bilateral. Strength 5/5 bilateral.   Assessment and Plan: Problem List Items Addressed This Visit    None    Visit Diagnoses     Plantar fasciitis, bilateral    -  Primary    Relevant Medications    meloxicam (MOBIC) 15 MG tablet    Capsulitis        Relevant Medications    meloxicam (MOBIC) 15 MG tablet    Bunion        Relevant Medications    meloxicam (MOBIC) 15 MG tablet    Hammertoe, unspecified laterality        Relevant Medications    meloxicam (MOBIC) 15 MG tablet    Foot pain, unspecified laterality        Relevant Medications    meloxicam (MOBIC) 15 MG tablet    Capsulitis        Left 2nd MTPJ    Relevant Medications    meloxicam (MOBIC) 15 MG tablet      -Complete examination performed.  -Previous x-rays reviewed. -Discussed with patient in detail the condition of plantar fasciitis, how this  occurs related to the foot type of the patient and general treatment options. -Dispensed night splint and instructed on use  -Refilled Mobic  -Continue with fascial braces, stretching, icing, good supportive shoes, inserts daily.  -Discussed long term care and reocurrence; will closely monitor; if fails to improve will consider other treatment modalities.  -Patient to return to office in 1 month for follow up or sooner if problems or questions arise.  Asencion Islam, DPM

## 2016-02-05 ENCOUNTER — Other Ambulatory Visit: Payer: Self-pay | Admitting: Obstetrics and Gynecology

## 2016-02-05 MED ORDER — PHENTERMINE HCL 37.5 MG PO TABS: 37.5000 mg | ORAL_TABLET | Freq: Two times a day (BID) | ORAL | Status: AC

## 2016-02-22 ENCOUNTER — Ambulatory Visit
Admission: RE | Admit: 2016-02-22 | Discharge: 2016-02-22 | Disposition: A | Discharge status: Home / Self Care | Payer: BLUE CROSS/BLUE SHIELD | Source: Ambulatory Visit | Attending: Obstetrics and Gynecology | Admitting: Obstetrics and Gynecology

## 2016-02-22 ENCOUNTER — Other Ambulatory Visit: Payer: Self-pay | Admitting: Obstetrics and Gynecology

## 2016-02-22 DIAGNOSIS — Z01419 Encounter for gynecological examination (general) (routine) without abnormal findings: Secondary | ICD-10-CM | POA: Diagnosis not present

## 2016-03-01 ENCOUNTER — Other Ambulatory Visit: Payer: Self-pay | Admitting: Sports Medicine

## 2016-03-05 ENCOUNTER — Telehealth: Payer: Self-pay

## 2016-03-05 NOTE — Telephone Encounter (Signed)
Refill is fine. Mobic 15mg  i tab daily qty 30 Thanks Dr. Marylene LandStover

## 2016-03-05 NOTE — Telephone Encounter (Signed)
Pt requesting refill on meloxicam

## 2016-03-07 ENCOUNTER — Ambulatory Visit: Payer: BLUE CROSS/BLUE SHIELD | Admitting: Sports Medicine

## 2016-03-26 ENCOUNTER — Other Ambulatory Visit: Payer: Self-pay | Admitting: Obstetrics and Gynecology

## 2016-03-26 DIAGNOSIS — F32A Depression, unspecified: Secondary | ICD-10-CM

## 2016-03-26 DIAGNOSIS — F3341 Major depressive disorder, recurrent, in partial remission: Secondary | ICD-10-CM

## 2016-03-26 DIAGNOSIS — F329 Major depressive disorder, single episode, unspecified: Secondary | ICD-10-CM

## 2016-03-26 MED ORDER — CITALOPRAM HYDROBROMIDE 10 MG PO TABS: 20.0000 mg | ORAL_TABLET | Freq: Every day | ORAL | 0 refills | Status: DC

## 2016-03-26 MED ORDER — FLUOXETINE HCL 20 MG PO CAPS: 10.0000 mg | ORAL_CAPSULE | Freq: Every day | ORAL | 1 refills | Status: DC

## 2016-03-26 NOTE — Progress Notes (Signed)
[facility] Phone Call Documentation  I received a call from Gloria Pierce, with report of worsening depression over last few months. Reports stressors at home and death of father as some precipitating factors, and that is progressively getting worse. Denies suicidal thoughts, but does feel hopeless. Tearful all the time. Concerned that Celexa isn't working anymore.  Was open to trying a different medication and counseling (as long as covered by insurance). presription placed to decrease celexa and start prozac. referral to [employer]. Patient to notify me if symptoms worsen in the mean time.   [name] [name], CNM [facility]

## 2016-04-06 ENCOUNTER — Other Ambulatory Visit: Payer: Self-pay | Admitting: Sports Medicine

## 2016-04-07 NOTE — Telephone Encounter (Signed)
Pt needs an appt prior to future refills. 

## 2016-05-08 ENCOUNTER — Telehealth: Payer: Self-pay | Admitting: *Deleted

## 2016-05-08 MED ORDER — MELOXICAM 15 MG PO TABS: ORAL_TABLET | ORAL | 0 refills | Status: DC

## 2016-05-08 NOTE — Telephone Encounter (Signed)
Pt requesting refill on Mobic until she can be seen by Dr. Logan BoresEvans on September 22nd.  Sent in refill to pharmacy. Informed pt.

## 2016-05-13 ENCOUNTER — Other Ambulatory Visit: Payer: Self-pay | Admitting: Obstetrics and Gynecology

## 2016-05-13 MED ORDER — FLUOXETINE HCL 40 MG PO CAPS: 40.0000 mg | ORAL_CAPSULE | Freq: Every day | ORAL | 4 refills | Status: DC

## 2016-05-23 ENCOUNTER — Ambulatory Visit (INDEPENDENT_AMBULATORY_CARE_PROVIDER_SITE_OTHER): Payer: BLUE CROSS/BLUE SHIELD | Admitting: Podiatry

## 2016-05-23 DIAGNOSIS — M79673 Pain in unspecified foot: Secondary | ICD-10-CM

## 2016-05-23 DIAGNOSIS — M722 Plantar fascial fibromatosis: Secondary | ICD-10-CM

## 2016-05-26 MED ORDER — MELOXICAM 15 MG PO TABS: ORAL_TABLET | ORAL | 1 refills | Status: DC

## 2016-05-26 NOTE — Progress Notes (Signed)
Subjective: Patient returns the clinic today for follow-up evaluation of bilateral heel pain. Patient states the pain started about 4 weeks ago it's worse when she's not on meloxicam. She currently restarted her meloxicam on 05/08/2016. She presents for further treatment and evaluation  Objective: Physical Exam General: The patient is alert and oriented x3 in no acute distress.  Dermatology: Skin is warm, dry and supple bilateral lower extremities. Negative for open lesions or macerations bilateral.   Vascular: Dorsalis Pedis and Posterior Tibial pulses palpable bilateral.  Capillary fill time is immediate to all digits.  Neurological: Epicritic and protective threshold intact bilateral.   Musculoskeletal: Tenderness to palpation at the medial calcaneal tubercale and through the insertion of the plantar fascia of the bilateral feet. All other joints range of motion within normal limits bilateral. Strength 5/5 in all groups bilateral.   Assessment: #1 plantar fasciitis bilateral heels #2 pain in bilateral feet  Problem List Items Addressed This Visit    None    Visit Diagnoses   None.     Plan of Care:   1. Patient evaluated. Xrays reviewed.   2.Instructed patient regarding therapies and modalities at home to alleviate symptoms.  4. Rx for Meloxicam given to patient.  5. Today the patient was scanned for custom inserts, low-profile for dress shoes.  6. Return to clinic in 4 weeks for reevaluation and orthotics.    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

## 2016-06-13 ENCOUNTER — Encounter: Payer: Self-pay | Admitting: Podiatry

## 2016-06-13 ENCOUNTER — Ambulatory Visit (INDEPENDENT_AMBULATORY_CARE_PROVIDER_SITE_OTHER): Payer: BLUE CROSS/BLUE SHIELD | Admitting: Podiatry

## 2016-06-13 DIAGNOSIS — M722 Plantar fascial fibromatosis: Secondary | ICD-10-CM

## 2016-06-13 NOTE — Patient Instructions (Signed)

## 2016-06-13 NOTE — Progress Notes (Signed)
Dispensed patient's orthotics with oral and written instructions for wearing. Patient will follow up with Dr. Evans in 1 month for an orthotic check.  

## 2016-07-18 ENCOUNTER — Ambulatory Visit: Payer: BLUE CROSS/BLUE SHIELD | Admitting: Podiatry

## 2016-09-15 ENCOUNTER — Other Ambulatory Visit: Payer: Self-pay | Admitting: Obstetrics and Gynecology

## 2016-09-19 ENCOUNTER — Other Ambulatory Visit: Payer: Self-pay | Admitting: Podiatry

## 2016-09-19 NOTE — Telephone Encounter (Signed)
Pt needs an appt prior to future refills. 

## 2016-09-23 ENCOUNTER — Telehealth: Payer: Self-pay | Admitting: *Deleted

## 2016-09-23 ENCOUNTER — Other Ambulatory Visit: Payer: Self-pay | Admitting: Obstetrics and Gynecology

## 2016-09-23 DIAGNOSIS — R202 Paresthesia of skin: Principal | ICD-10-CM

## 2016-09-23 DIAGNOSIS — R200 Anesthesia of skin: Secondary | ICD-10-CM

## 2016-09-23 NOTE — Telephone Encounter (Signed)
done

## 2016-09-23 NOTE — Telephone Encounter (Signed)
Pt would like referral she is having pain in her L thumb

## 2016-10-14 ENCOUNTER — Other Ambulatory Visit: Payer: Self-pay | Admitting: Podiatry

## 2016-10-17 ENCOUNTER — Ambulatory Visit: Payer: BLUE CROSS/BLUE SHIELD | Admitting: Family Medicine

## 2016-10-24 ENCOUNTER — Ambulatory Visit (INDEPENDENT_AMBULATORY_CARE_PROVIDER_SITE_OTHER): Payer: BLUE CROSS/BLUE SHIELD | Admitting: Obstetrics and Gynecology

## 2016-10-24 DIAGNOSIS — Z23 Encounter for immunization: Secondary | ICD-10-CM

## 2016-10-24 MED ORDER — TETANUS-DIPHTH-ACELL PERTUSSIS 5-2.5-18.5 LF-MCG/0.5 IM SUSP
0.5000 mL | Freq: Once | INTRAMUSCULAR | Status: AC
  Administered 2016-10-24: 0.5 mL via INTRAMUSCULAR

## 2016-10-24 NOTE — Progress Notes (Signed)
Patient needs updated TDAP- given today by nurse. RTC as needed.

## 2016-12-31 ENCOUNTER — Other Ambulatory Visit: Payer: Self-pay | Admitting: Obstetrics and Gynecology

## 2017-01-16 ENCOUNTER — Ambulatory Visit (INDEPENDENT_AMBULATORY_CARE_PROVIDER_SITE_OTHER): Payer: BLUE CROSS/BLUE SHIELD | Admitting: Obstetrics and Gynecology

## 2017-01-16 ENCOUNTER — Encounter: Payer: Self-pay | Admitting: Obstetrics and Gynecology

## 2017-01-16 VITALS — BP 146/94 | HR 65 | Ht 67.0 in | Wt 239.1 lb

## 2017-01-16 DIAGNOSIS — Z01411 Encounter for gynecological examination (general) (routine) with abnormal findings: Secondary | ICD-10-CM

## 2017-01-16 MED ORDER — AMLODIPINE BESYLATE 5 MG PO TABS: 5.0000 mg | ORAL_TABLET | Freq: Every day | ORAL | 1 refills | Status: DC

## 2017-01-16 MED ORDER — FLUOXETINE HCL 40 MG PO CAPS: ORAL_CAPSULE | ORAL | 6 refills | Status: DC

## 2017-01-16 NOTE — Progress Notes (Signed)
Subjective:   PATRECIA Pierce is a 51 y.o. G28P1 Caucasian female here for a routine well-woman exam.  No LMP recorded. Patient is not currently having periods (Reason: Oral contraceptives).    Current complaints: fatigue, weight gain, PCP: none       does desire labs  Social History: Sexual: heterosexual Marital Status: married Living situation: with family Occupation: hairdresser Tobacco/alcohol: no tobacco use Illicit drugs: no history of illicit drug use  The following portions of the patient's history were reviewed and updated as appropriate: allergies, current medications, past family history, past medical history, past social history, past surgical history and problem list.  Past Medical History Past Medical History:  Diagnosis Date  . Allergy   . Depression   . GERD (gastroesophageal reflux disease)   . Hypertension   . Low potassium syndrome   . Obesity   . Vaginal Pap smear, abnormal     Past Surgical History Past Surgical History:  Procedure Laterality Date  . ADENOIDECTOMY    . AUGMENTATION MAMMAPLASTY     2006  . CHOLECYSTECTOMY    . DILATION AND CURETTAGE OF UTERUS      Gynecologic History G3P1  No LMP recorded. Patient is not currently having periods (Reason: Oral contraceptives). Contraception: OCP (estrogen/progesterone) Last Pap: 2016. Results were: normal Last mammogram: 2017. Results were: normal   Obstetric History OB History  Gravida Para Term Preterm AB Living  3 1          SAB TAB Ectopic Multiple Live Births               # Outcome Date GA Lbr Len/2nd Weight Sex Delivery Anes PTL Lv  3 Gravida           2 Gravida           1 Para               Current Medications Current Outpatient Prescriptions on File Prior to Visit  Medication Sig Dispense Refill  . FLUoxetine (PROZAC) 40 MG capsule TAKE 1 CAPSULE(40 MG) BY MOUTH DAILY 30 capsule 0  . hydrochlorothiazide (HYDRODIURIL) 25 MG tablet Take 1 tablet (25 mg total) by mouth daily. 90  tablet 4  . norethindrone (MICRONOR,CAMILA,ERRIN) 0.35 MG tablet TAKE 1 TABLET BY MOUTH EVERY DAY 28 tablet 0  . potassium chloride (K-DUR) 10 MEQ tablet TAKE 1 TABLET BY MOUTH TWICE A WEEK 90 tablet 4  . citalopram (CELEXA) 10 MG tablet Take 2 tablets (20 mg total) by mouth daily. After 2 weeks, have patient go down to 10mg  daily for 2 weeks then stop medication; while adding in Prozac. 60 tablet 0  . cyanocobalamin (,VITAMIN B-12,) 1000 MCG/ML injection Reported on 01/11/2016 (Patient not taking: Reported on 01/16/2017) 3 mL 4  . phentermine (ADIPEX-P) 37.5 MG tablet Take 1 tablet (37.5 mg total) by mouth 2 (two) times daily before a meal. Reported on 01/11/2016 (Patient not taking: Reported on 01/16/2017) 60 tablet 2   Current Facility-Administered Medications on File Prior to Visit  Medication Dose Route Frequency Provider Last Rate Last Dose  . triamcinolone acetonide (KENALOG) 10 MG/ML injection 10 mg  10 mg Other Once Gloria Pierce, DPM        Review of Systems Patient denies any headaches, blurred vision, shortness of breath, chest pain, abdominal pain, problems with bowel movements, urination, or intercourse.  Objective:  BP (!) 146/94   Pulse 65   Ht 5\' 7"  (1.702 m)   Wt 239 lb 1.6  oz (108.5 kg)   BMI 37.45 kg/m  Physical Exam  General:  Well developed, well nourished, no acute distress. She is alert and oriented x3. Skin:  Warm and dry Neck:  Midline trachea, no thyromegaly or nodules Cardiovascular: Regular rate and rhythm, no murmur heard Lungs:  Effort normal, all lung fields clear to auscultation bilaterally Breasts:  No dominant palpable mass, retraction, or nipple discharge Abdomen:  Soft, non tender, no hepatosplenomegaly or masses Pelvic:  External genitalia is normal in appearance.  The vagina is normal in appearance. The cervix is bulbous, no CMT.  Thin prep pap is not done Uterus is felt to be normal size, shape, and contour.  No adnexal masses or tenderness  noted. Extremities:  No swelling or varicosities noted Psych:  She has a normal mood and affect  Assessment:   Healthy well-woman exam Hypertension Depression Obesity   Plan:  Change BP med to norvasc, and will stop HCTZ after two weeks. F/U 1 year for Ae, or sooner if needed Mammogram ordered or sooner if problems Colonoscopy due soon, will consider next year  Gloria Pierce Gloria Pierce, CNM

## 2017-01-16 NOTE — Patient Instructions (Signed)
  Place annual gynecologic exam patient instructions here.  Thank you for enrolling in MyChart. Please follow the instructions below to securely access your online medical record. MyChart allows you to send messages to your doctor, view your test results, manage appointments, and more.   How Do I Sign Up? 1. In your Internet browser, go to [employer] Address [employer] and enter [email or web address]. 2. Click on the Sign Up Now link in the Sign In box. You will see the New Member Sign Up page. 3. Enter your MyChart Access Code exactly as it appears below. You will not need to use this code after you've completed the sign-up process. If you do not sign up before the expiration date, you must request a new code.  MyChart Access Code: [email or web address] Expires: 03/17/2017  9:15 AM  4. Enter your Social Security Number ([id number]) and Date of Birth (mm/dd/yyyy) as indicated and click Submit. You will be taken to the next sign-up page. 5. Create a MyChart ID. This will be your MyChart login ID and cannot be changed, so think of one that is secure and easy to remember. 6. Create a MyChart password. You can change your password at any time. 7. Enter your Password Reset Question and Answer. This can be used at a later time if you forget your password.  8. Enter your e-mail address. You will receive e-mail notification when new information is available in MyChart. 9. Click Sign Up. You can now view your medical record.   Additional Information Remember, MyChart is NOT to be used for urgent needs. For medical emergencies, dial 911.

## 2017-01-17 LAB — COMPREHENSIVE METABOLIC PANEL
ALT: 32 IU/L (ref 0–32)
AST: 26 IU/L (ref 0–40)
Albumin/Globulin Ratio: 1.6 (ref 1.2–2.2)
Albumin: 4.4 g/dL (ref 3.5–5.5)
Alkaline Phosphatase: 71 IU/L (ref 39–117)
BUN/Creatinine Ratio: 22 (ref 9–23)
BUN: 16 mg/dL (ref 6–24)
Bilirubin Total: 0.3 mg/dL (ref 0.0–1.2)
CALCIUM: 9.9 mg/dL (ref 8.7–10.2)
CO2: 27 mmol/L (ref 18–29)
CREATININE: 0.72 mg/dL (ref 0.57–1.00)
Chloride: 97 mmol/L (ref 96–106)
GFR calc Af Amer: 114 mL/min/{1.73_m2} (ref >59)
GFR, EST NON AFRICAN AMERICAN: 99 mL/min/{1.73_m2} (ref >59)
GLUCOSE: 105 mg/dL — AB (ref 65–99)
Globulin, Total: 2.8 g/dL (ref 1.5–4.5)
Potassium: 4.1 mmol/L (ref 3.5–5.2)
Sodium: 141 mmol/L (ref 134–144)
Total Protein: 7.2 g/dL (ref 6.0–8.5)

## 2017-01-17 LAB — VITAMIN D 25 HYDROXY (VIT D DEFICIENCY, FRACTURES): VIT D 25 HYDROXY: 48.9 ng/mL (ref 30.0–100.0)

## 2017-01-17 LAB — LIPID PANEL
CHOL/HDL RATIO: 4.1 ratio (ref 0.0–4.4)
Cholesterol, Total: 238 mg/dL — ABNORMAL HIGH (ref 100–199)
HDL: 58 mg/dL (ref >39)
LDL CALC: 163 mg/dL — AB (ref 0–99)
Triglycerides: 87 mg/dL (ref 0–149)
VLDL Cholesterol Cal: 17 mg/dL (ref 5–40)

## 2017-01-17 LAB — CBC
HEMATOCRIT: 41.6 % (ref 34.0–46.6)
Hemoglobin: 13.5 g/dL (ref 11.1–15.9)
MCH: 31 pg (ref 26.6–33.0)
MCHC: 32.5 g/dL (ref 31.5–35.7)
MCV: 95 fL (ref 79–97)
Platelets: 245 10*3/uL (ref 150–379)
RBC: 4.36 x10E6/uL (ref 3.77–5.28)
RDW: 13.6 % (ref 12.3–15.4)
WBC: 5.8 10*3/uL (ref 3.4–10.8)

## 2017-01-17 LAB — TSH: TSH: 1.66 u[IU]/mL (ref 0.450–4.500)

## 2017-01-17 LAB — FERRITIN: Ferritin: 118 ng/mL (ref 15–150)

## 2017-01-19 ENCOUNTER — Other Ambulatory Visit: Payer: Self-pay | Admitting: Obstetrics and Gynecology

## 2017-01-21 ENCOUNTER — Other Ambulatory Visit: Payer: Self-pay | Admitting: Obstetrics and Gynecology

## 2017-01-21 MED ORDER — ATORVASTATIN CALCIUM 20 MG PO TABS: 20.0000 mg | ORAL_TABLET | Freq: Every day | ORAL | 6 refills | Status: DC

## 2017-01-21 MED ORDER — ASPIRIN EC 81 MG PO TBEC: 81.0000 mg | DELAYED_RELEASE_TABLET | Freq: Every day | ORAL | 2 refills | Status: DC

## 2017-01-22 ENCOUNTER — Telehealth: Payer: Self-pay | Admitting: Obstetrics and Gynecology

## 2017-01-22 NOTE — Telephone Encounter (Signed)
Patient wants a call regarding her test results. Thanks

## 2017-01-22 NOTE — Telephone Encounter (Signed)
Spoke with pt

## 2017-01-23 NOTE — Telephone Encounter (Signed)
-----   Message from Purcell NailsMelody N Shambley, [place] sent at 01/21/2017  2:45 PM EDT ----- Please let her know labs look fine except cholesterol, numbers are higher and she needs to be on medications for it. I sent in a rx for lipitor and a baby aspirin to start daily. Then I want to recheck labs in 6 months.

## 2017-01-23 NOTE — Telephone Encounter (Signed)
Done-ac 

## 2017-01-27 ENCOUNTER — Other Ambulatory Visit: Payer: Self-pay | Admitting: Obstetrics and Gynecology

## 2017-02-20 IMAGING — US US BREAST LTD UNI RIGHT INC AXILLA
1 series · 8 of 8 positions shown · non-contrast
Comparison: 02/03/2014 and earlier

CLINICAL DATA: Delayed six-month follow-up for right breast nodule.
Patient has not had MRI.

EXAM:
DIGITAL DIAGNOSTIC BILATERAL MAMMOGRAM WITH CAD
ULTRASOUND RIGHT BREAST

[Series 1: us breast ltd uni right inc axilla · 0.06mm/px · 8 of 8 slices shown]
[im 1/8]
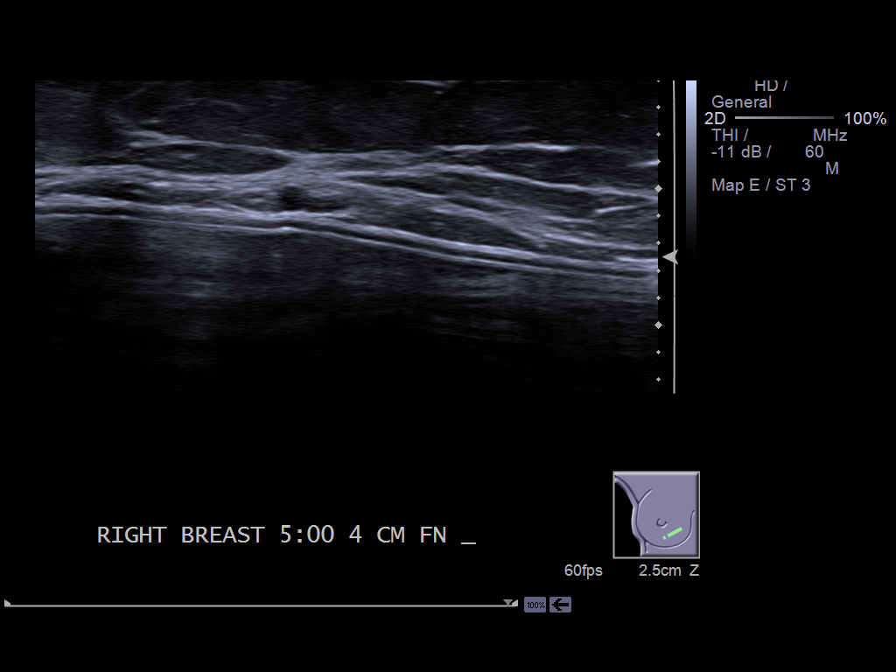
[im 2/8]
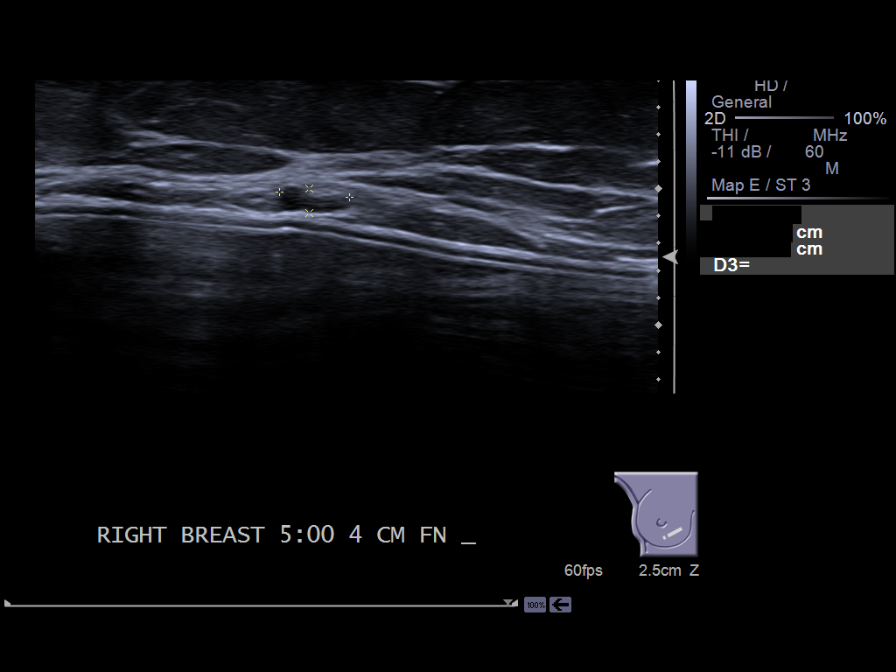
[im 3/8]
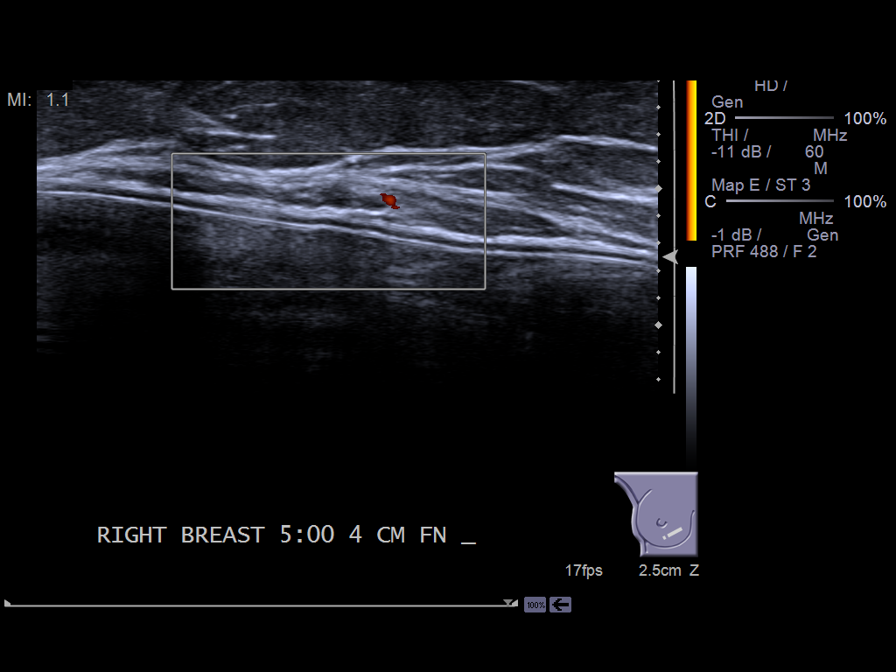
[im 4/8]
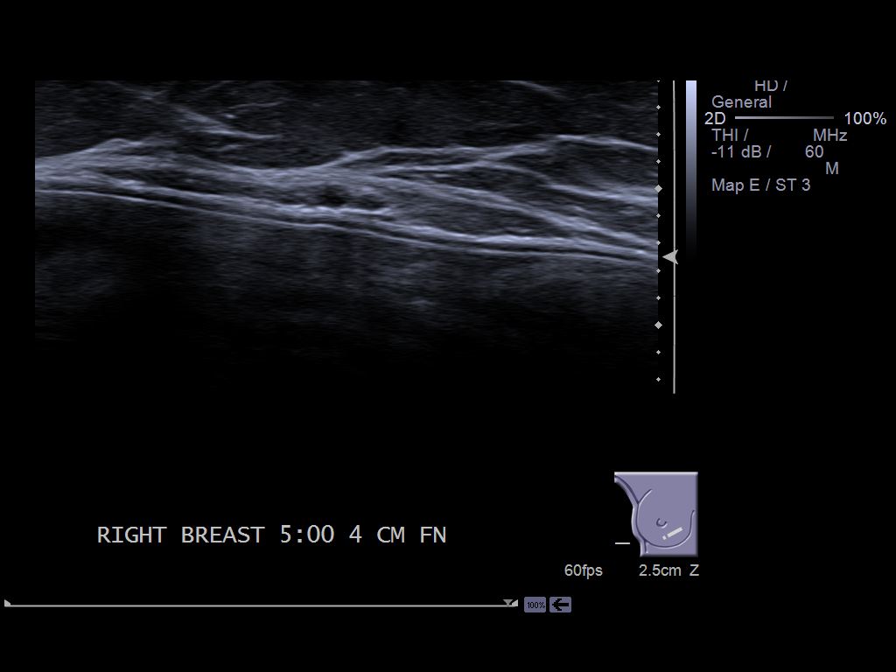
[im 5/8]
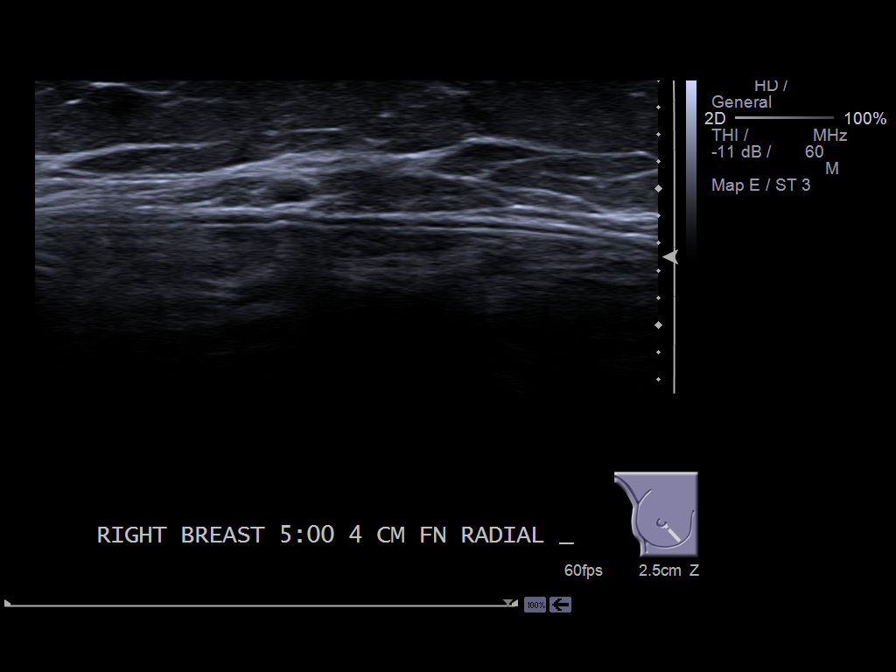
[im 6/8]
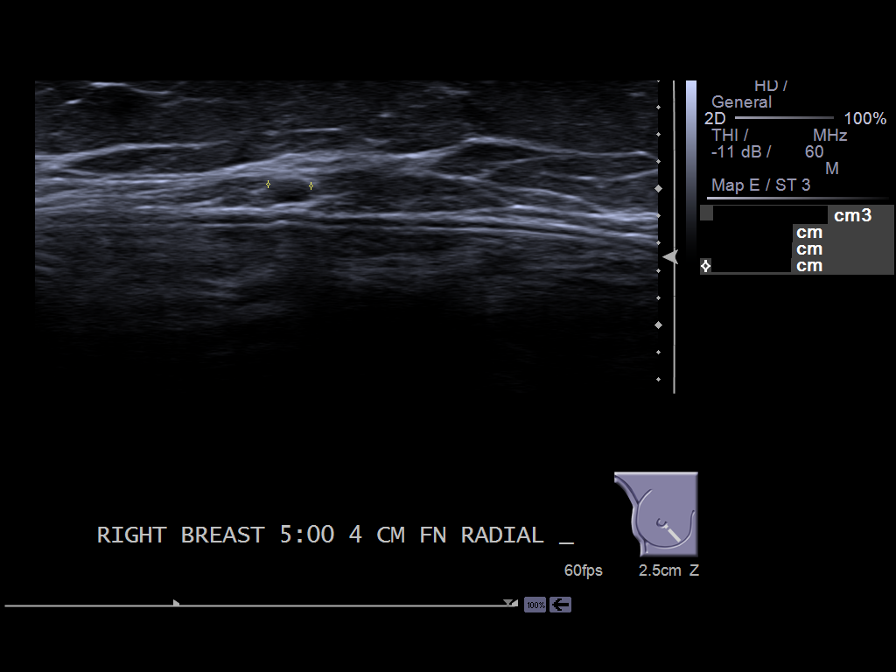
[im 7/8]
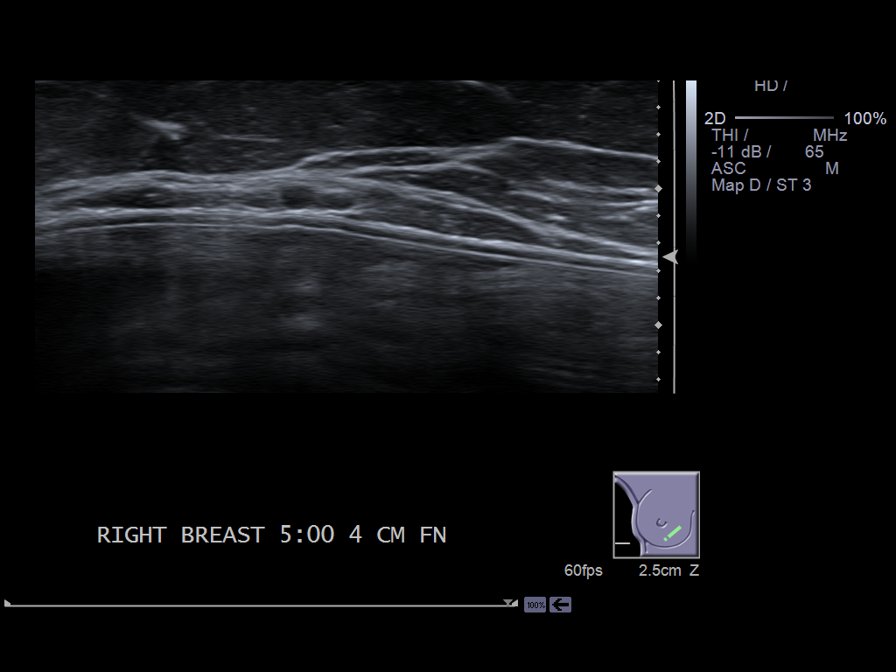
[im 8/8]
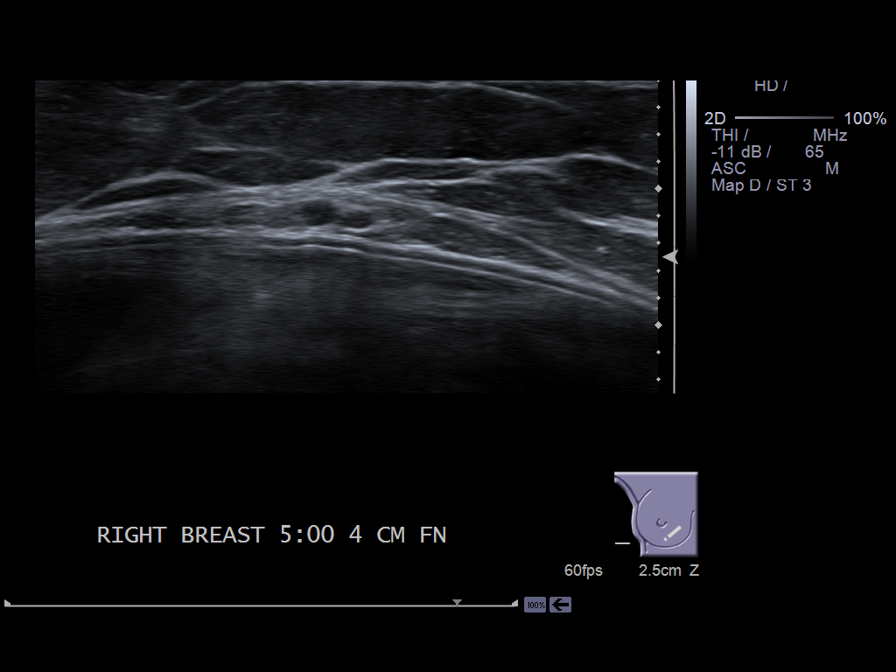

[8 of 8 positions shown; findings below may reference images not displayed]

ACR Breast Density Category b: There are scattered areas of
fibroglandular density.
FINDINGS: The patient has bilateral retropectoral saline implants. Images
demonstrate a stable bilobed nodule just medial to the right nipple,
not seen in the oblique views. No suspicious mass identified in the
left breast.

Mammographic images were processed with CAD.

On physical exam, I palpate no abnormality medial to the right
nipple.

Targeted ultrasound is performed, showing a bilobed nodule in the 5
o'clock location of the right breast 4 cm from the nipple which
measures 0.5 x 0.2 x 0.3 cm. Nodule likely represents a benign
intramammary lymph node versus adjacent cysts. No acoustic
shadowing.
IMPRESSION: 1. Stable appearance of bilobed nodule in the 5 o'clock location of
the right breast, identified by ultrasound today.
2. Continued followup is recommended to document 2 years of
stability.

RECOMMENDATION:
Bilateral diagnostic mammogram and right breast ultrasound suggested
in 1 year.

I have discussed the findings and recommendations with the patient.
Results were also provided in writing at the conclusion of the
visit. If applicable, a reminder letter will be sent to the patient
regarding the next appointment.

BI-RADS CATEGORY  3: Probably benign.

## 2017-02-24 ENCOUNTER — Other Ambulatory Visit: Payer: Self-pay | Admitting: Obstetrics and Gynecology

## 2017-02-24 MED ORDER — HYDROCHLOROTHIAZIDE 25 MG PO TABS: 25.0000 mg | ORAL_TABLET | Freq: Every day | ORAL | 4 refills | Status: AC

## 2017-02-27 ENCOUNTER — Ambulatory Visit: Payer: BLUE CROSS/BLUE SHIELD

## 2017-03-03 ENCOUNTER — Other Ambulatory Visit: Payer: Self-pay | Admitting: Obstetrics and Gynecology

## 2017-03-06 ENCOUNTER — Ambulatory Visit
Admission: RE | Admit: 2017-03-06 | Discharge: 2017-03-06 | Disposition: A | Discharge status: Home / Self Care | Payer: BLUE CROSS/BLUE SHIELD | Source: Ambulatory Visit | Attending: Obstetrics and Gynecology | Admitting: Obstetrics and Gynecology

## 2017-03-06 DIAGNOSIS — Z01419 Encounter for gynecological examination (general) (routine) without abnormal findings: Secondary | ICD-10-CM | POA: Diagnosis not present

## 2017-03-06 DIAGNOSIS — Z1231 Encounter for screening mammogram for malignant neoplasm of breast: Secondary | ICD-10-CM | POA: Diagnosis not present

## 2017-03-06 DIAGNOSIS — Z01411 Encounter for gynecological examination (general) (routine) with abnormal findings: Secondary | ICD-10-CM

## 2017-07-13 ENCOUNTER — Ambulatory Visit: Payer: BLUE CROSS/BLUE SHIELD | Admitting: Obstetrics and Gynecology

## 2017-07-16 ENCOUNTER — Telehealth: Payer: Self-pay | Admitting: Obstetrics and Gynecology

## 2017-07-16 NOTE — Telephone Encounter (Signed)
The patient called and asked for Gloria Pierce to call her back as soon as possible. No other information was disclosed. Please advise.

## 2017-07-20 NOTE — Telephone Encounter (Signed)
Done-ac 

## 2017-07-26 ENCOUNTER — Other Ambulatory Visit: Payer: Self-pay | Admitting: Obstetrics and Gynecology

## 2017-07-29 ENCOUNTER — Other Ambulatory Visit: Payer: Self-pay | Admitting: Obstetrics and Gynecology

## 2017-08-02 ENCOUNTER — Other Ambulatory Visit: Payer: Self-pay | Admitting: Obstetrics and Gynecology

## 2017-08-21 ENCOUNTER — Other Ambulatory Visit: Payer: Self-pay | Admitting: Obstetrics and Gynecology

## 2017-08-27 ENCOUNTER — Other Ambulatory Visit: Payer: Self-pay | Admitting: Obstetrics and Gynecology

## 2017-09-21 ENCOUNTER — Other Ambulatory Visit: Payer: Self-pay | Admitting: Obstetrics and Gynecology

## 2017-10-16 ENCOUNTER — Other Ambulatory Visit: Payer: Self-pay | Admitting: Obstetrics and Gynecology

## 2017-12-24 ENCOUNTER — Other Ambulatory Visit: Payer: Self-pay | Admitting: Obstetrics and Gynecology

## 2018-01-01 ENCOUNTER — Other Ambulatory Visit: Payer: Self-pay | Admitting: Obstetrics and Gynecology

## 2018-01-22 ENCOUNTER — Ambulatory Visit (INDEPENDENT_AMBULATORY_CARE_PROVIDER_SITE_OTHER): Payer: BLUE CROSS/BLUE SHIELD | Admitting: Obstetrics and Gynecology

## 2018-01-22 ENCOUNTER — Encounter: Payer: Self-pay | Admitting: Obstetrics and Gynecology

## 2018-01-22 VITALS — BP 107/72 | HR 57 | Ht 66.5 in | Wt 246.8 lb

## 2018-01-22 DIAGNOSIS — Z01419 Encounter for gynecological examination (general) (routine) without abnormal findings: Secondary | ICD-10-CM | POA: Diagnosis not present

## 2018-01-22 NOTE — Progress Notes (Signed)
Subjective:   Gloria Pierce is a 52 y.o. G77P1 Caucasian female here for a routine well-woman exam.  No LMP recorded. Patient is premenopausal.    Current complaints: switched to zoloft by PCP but can't tell a difference. PCP: Irving [place] in [place], [place]     does desire labs  Social History: Sexual: heterosexual Marital Status: married Living situation: with family Occupation: hairdresser Tobacco/alcohol: no tobacco use Illicit drugs: no history of illicit drug use  The following portions of the patient's history were reviewed and updated as appropriate: allergies, current medications, past family history, past medical history, past social history, past surgical history and problem list.  Past Medical History Past Medical History:  Diagnosis Date  . Allergy   . Depression   . GERD (gastroesophageal reflux disease)   . Hypertension   . Low potassium syndrome   . Obesity   . Vaginal Pap smear, abnormal     Past Surgical History Past Surgical History:  Procedure Laterality Date  . ADENOIDECTOMY    . AUGMENTATION MAMMAPLASTY     2006  . CHOLECYSTECTOMY    . DILATION AND CURETTAGE OF UTERUS      Gynecologic History G3P1  No LMP recorded. Patient is premenopausal. Contraception: post menopausal status Last Pap: 2016. Results were: normal Last mammogram: 03/2017. Results were: normal   Obstetric History OB History  Gravida Para Term Preterm AB Living  3 1          SAB TAB Ectopic Multiple Live Births               # Outcome Date GA Lbr Len/2nd Weight Sex Delivery Anes PTL Lv  3 Gravida           2 Gravida           1 Para             Current Medications Current Outpatient Medications on File Prior to Visit  Medication Sig Dispense Refill  . ASPIRIN LOW DOSE 81 MG EC tablet TAKE 1 TABLET BY MOUTH DAILY. 120 tablet 4  . atorvastatin (LIPITOR) 20 MG tablet TAKE 1 TABLET(20 MG) BY MOUTH DAILY 30 tablet 3  . hydrochlorothiazide (HYDRODIURIL) 25 MG tablet Take 1 tablet  (25 mg total) by mouth daily. 90 tablet 4  . lisinopril (PRINIVIL,ZESTRIL) 10 MG tablet Take 10 mg by mouth daily.    . potassium chloride (K-DUR) 10 MEQ tablet TAKE 1 TABLET BY MOUTH 2 TIMES A WEEK 90 tablet 4  . sertraline (ZOLOFT) 50 MG tablet Take 50 mg by mouth 2 (two) times daily.    . cyanocobalamin (,VITAMIN B-12,) 1000 MCG/ML injection Reported on 01/11/2016 (Patient not taking: Reported on 01/16/2017) 3 mL 4  . FLUoxetine (PROZAC) 40 MG capsule TAKE 1 CAPSULE(40 MG) BY MOUTH DAILY (Patient not taking: Reported on 01/22/2018) 30 capsule 3  . phentermine (ADIPEX-P) 37.5 MG tablet Take 1 tablet (37.5 mg total) by mouth 2 (two) times daily before a meal. Reported on 01/11/2016 (Patient not taking: Reported on 01/16/2017) 60 tablet 2   Current Facility-Administered Medications on File Prior to Visit  Medication Dose Route Frequency Provider Last Rate Last Dose  . triamcinolone acetonide (KENALOG) 10 MG/ML injection 10 mg  10 mg Other Once Asencion Islam, DPM        Review of Systems Patient denies any headaches, blurred vision, shortness of breath, chest pain, abdominal pain, problems with bowel movements, urination, or intercourse.  Objective:  BP 107/72   Pulse Marland Kitchen)  57   Ht 5' 6.5" (1.689 m)   Wt 246 lb 12.8 oz (111.9 kg)   BMI 39.24 kg/m  Physical Exam  General:  Well developed, well nourished, no acute distress. She is alert and oriented x3. Skin:  Warm and dry Neck:  Midline trachea, no thyromegaly or nodules Cardiovascular: Regular rate and rhythm, no murmur heard Lungs:  Effort normal, all lung fields clear to auscultation bilaterally Breasts:  No dominant palpable mass, retraction, or nipple discharge Abdomen:  Soft, non tender, no hepatosplenomegaly or masses Pelvic:  External genitalia is normal in appearance.  The vagina is normal in appearance. The cervix is bulbous, no CMT.  Thin prep pap is not done . Uterus is felt to be normal size, shape, and contour.  No adnexal  masses or tenderness noted. Extremities:  No swelling or varicosities noted Psych:  She has a normal mood and affect  Assessment:   Healthy well-woman exam HTN Elevated cholesterol Obesity Depression on SSRI  Plan:   F/U 1 year  for AE, or sooner if needed Mammogram ordered or sooner if problems Colonoscopy ordered.  Melody Suzan Nailer, CNM

## 2018-02-01 ENCOUNTER — Other Ambulatory Visit: Payer: Self-pay | Admitting: Obstetrics and Gynecology

## 2018-02-01 DIAGNOSIS — Z1231 Encounter for screening mammogram for malignant neoplasm of breast: Secondary | ICD-10-CM

## 2018-02-10 ENCOUNTER — Other Ambulatory Visit: Payer: Self-pay | Admitting: Obstetrics and Gynecology

## 2018-03-09 ENCOUNTER — Ambulatory Visit
Admission: RE | Admit: 2018-03-09 | Discharge: 2018-03-09 | Disposition: A | Discharge status: Home / Self Care | Payer: BLUE CROSS/BLUE SHIELD | Source: Ambulatory Visit | Attending: Obstetrics and Gynecology | Admitting: Obstetrics and Gynecology

## 2018-03-09 DIAGNOSIS — Z1231 Encounter for screening mammogram for malignant neoplasm of breast: Secondary | ICD-10-CM | POA: Insufficient documentation

## 2018-04-20 ENCOUNTER — Other Ambulatory Visit: Payer: Self-pay | Admitting: Obstetrics and Gynecology

## 2018-08-21 ENCOUNTER — Other Ambulatory Visit: Payer: Self-pay | Admitting: Obstetrics and Gynecology

## 2018-11-24 ENCOUNTER — Other Ambulatory Visit: Payer: Self-pay | Admitting: Obstetrics and Gynecology

## 2019-01-28 ENCOUNTER — Encounter: Payer: BLUE CROSS/BLUE SHIELD | Admitting: Obstetrics and Gynecology

## 2019-01-29 ENCOUNTER — Other Ambulatory Visit: Payer: Self-pay | Admitting: Obstetrics and Gynecology

## 2019-01-31 ENCOUNTER — Other Ambulatory Visit: Payer: Self-pay

## 2019-01-31 MED ORDER — ASPIRIN 81 MG PO TBEC: 81.0000 mg | DELAYED_RELEASE_TABLET | Freq: Every day | ORAL | 4 refills | Status: AC

## 2019-02-19 ENCOUNTER — Other Ambulatory Visit: Payer: Self-pay | Admitting: Obstetrics and Gynecology

## 2019-05-09 ENCOUNTER — Other Ambulatory Visit: Payer: Self-pay | Admitting: Obstetrics and Gynecology

## 2019-10-07 ENCOUNTER — Telehealth: Payer: Self-pay

## 2019-10-07 NOTE — Telephone Encounter (Signed)
Called pt to discuss her medication refill for lipitor. Pt stated that she no longer would be attending [facility] anymore and has a PCP now that will handle her medication.

## 2020-05-15 ENCOUNTER — Ambulatory Visit: Payer: BLUE CROSS/BLUE SHIELD | Attending: Internal Medicine

## 2020-05-15 DIAGNOSIS — Z23 Encounter for immunization: Secondary | ICD-10-CM

## 2020-05-15 NOTE — Progress Notes (Signed)
   Covid-19 Vaccination Clinic  Name:  Gloria Pierce    MRN: [id number] DOB: [birth year]  05/15/2020  Ms. Rasheed was observed post Covid-19 immunization for 15 minutes without incident. She was provided with Vaccine Information Sheet and instruction to access the V-Safe system.   Ms. Dall was instructed to call 911 with any severe reactions post vaccine: Marland Kitchen Difficulty breathing  . Swelling of face and throat  . A fast heartbeat  . A bad rash all over body  . Dizziness and weakness   Immunizations Administered    Name Date Dose VIS Date Route   Pfizer COVID-19 Vaccine 05/15/2020  7:00 PM 0.3 mL 10/26/2018 Intramuscular   Manufacturer: [employer], [employer]   Lot: [id number]   NDC: [address]-6
